# Patient Record
Sex: Male | Born: 1990 | Race: Black or African American | Hispanic: No | Marital: Single | Smoking: Never smoker
Health system: Southern US, Community
[De-identification: ages and names within clinical notes are randomized; demographics above are authoritative.]

## PROBLEM LIST (undated history)

## (undated) DIAGNOSIS — N200 Calculus of kidney: Secondary | ICD-10-CM

---
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
## 2015-06-28 ENCOUNTER — Emergency Department: Payer: Self-pay

## 2015-06-28 ENCOUNTER — Emergency Department
Admission: EM | Admit: 2015-06-28 | Discharge: 2015-06-28 | Disposition: A | Discharge status: Home / Self Care | Payer: Self-pay | Attending: Emergency Medicine | Admitting: Emergency Medicine

## 2015-06-28 DIAGNOSIS — K5900 Constipation, unspecified: Secondary | ICD-10-CM | POA: Insufficient documentation

## 2015-06-28 DIAGNOSIS — J069 Acute upper respiratory infection, unspecified: Secondary | ICD-10-CM | POA: Insufficient documentation

## 2015-06-28 DIAGNOSIS — R197 Diarrhea, unspecified: Secondary | ICD-10-CM | POA: Insufficient documentation

## 2015-06-28 MED ORDER — GUAIFENESIN-CODEINE 100-10 MG/5ML PO SOLN: 10.0000 mL | ORAL | Status: DC | PRN

## 2015-06-28 MED ORDER — IBUPROFEN 800 MG PO TABS: 800.0000 mg | ORAL_TABLET | Freq: Three times a day (TID) | ORAL | Status: DC | PRN

## 2015-06-28 MED ORDER — CHLORPHENIRAMINE MALEATE 4 MG PO TABS: 4.0000 mg | ORAL_TABLET | Freq: Two times a day (BID) | ORAL | Status: DC | PRN

## 2015-06-28 MED ORDER — AZITHROMYCIN 250 MG PO TABS: ORAL_TABLET | ORAL | Status: DC

## 2015-06-28 NOTE — ED Provider Notes (Signed)
[facility] Emergency Department Provider Note  ____________________________________________  Time seen: Approximately 8:02 PM  I have reviewed the triage vital signs and the nursing notes.   HISTORY  Chief Complaint Cough    HPI Roy Bridges is a 25 y.o. male presents for evaluation of cough congestion chills and body aches 3 days. Patient states that it is extremely loose stools and extremely hard stools associated with severe abdominal cramping. Had fever and chills which is comes and goes. Positive runny nose. Working 2 jobs   No past medical history on file.  There are no active problems to display for this patient.   No past surgical history on file.  Current Outpatient Rx  Name  Route  Sig  Dispense  Refill  . azithromycin (ZITHROMAX Z-PAK) 250 MG tablet      Take 2 tablets (500 mg) on  Day 1,  followed by 1 tablet (250 mg) once daily on Days 2 through 5.   6 each   0   . chlorpheniramine (CHLOR-TRIMETON) 4 MG tablet   Oral   Take 1 tablet (4 mg total) by mouth 2 (two) times daily as needed for allergies or rhinitis.   30 tablet   0   . guaiFENesin-codeine 100-10 MG/5ML syrup   Oral   Take 10 mLs by mouth every 4 (four) hours as needed for cough.   180 mL   0   . ibuprofen (ADVIL,MOTRIN) 800 MG tablet   Oral   Take 1 tablet (800 mg total) by mouth every 8 (eight) hours as needed.   30 tablet   0     Allergies Review of patient's allergies indicates no known allergies.  No family history on file.  Social History Social History  Substance Use Topics  . Smoking status: Not on file  . Smokeless tobacco: Not on file  . Alcohol Use: Not on file    Review of Systems Constitutional: Positive fever/chills Eyes: No visual changes. ENT: No sore throat. Cardiovascular: Denies chest pain. Respiratory: Denies shortness of breath. Positive for cough Gastrointestinal: Positive abdominal pain.  No nausea, no vomiting.   Complains of both diarrhea and constipation. With some cramping Genitourinary: Negative for dysuria. Musculoskeletal: Negative for back pain. Skin: Negative for rash. Neurological: Negative for headaches, focal weakness or numbness.  10-point ROS otherwise negative.  ____________________________________________   PHYSICAL EXAM:  VITAL SIGNS: ED Triage Vitals  Enc Vitals Group     BP 06/28/15 1941 122/72 mmHg     Pulse Rate 06/28/15 1941 105     Resp 06/28/15 1941 18     Temp 06/28/15 1941 98.5 F (36.9 C)     Temp Source 06/28/15 1941 Oral     SpO2 06/28/15 1941 96 %     Weight 06/28/15 1941 130 lb (58.968 kg)     Height 06/28/15 1941  (1.727 m)     Head Cir --      Peak Flow --      Pain Score 06/28/15 1942 7     Pain Loc --      Pain Edu? --      Excl. in GC? --     Constitutional: Alert and oriented. Well appearing and in no acute distress. Head: Atraumatic. Nose: No congestion/rhinnorhea. Mouth/Throat: Mucous membranes are moist.  Oropharynx non-erythematous. Neck: No stridor.   Cardiovascular: Normal rate, regular rhythm. Grossly normal heart sounds.  Good peripheral circulation. Respiratory: Normal respiratory effort.  No retractions. Lungs coarse  breath sounds bilaterally with no rhonchi noted. Gastrointestinal: Soft and mildly tender No distention. No abdominal bruits. No CVA tenderness. Musculoskeletal: No lower extremity tenderness nor edema.  No joint effusions. Neurologic:  Normal speech and language. No gross focal neurologic deficits are appreciated. No gait instability. Skin:  Skin is warm, dry and intact. No rash noted. Psychiatric: Mood and affect are normal. Speech and behavior are normal.  ____________________________________________   LABS (all labs ordered are listed, but only abnormal results are displayed)  Labs Reviewed - No data to display  RADIOLOGY  No acute cardiopulmonary findings. Monitor stool noted within the intestinal  blockage. No air-fluid levels. ____________________________________________   PROCEDURES  Procedure(s) performed: None  Critical Care performed: No  ____________________________________________   INITIAL IMPRESSION / ASSESSMENT AND PLAN / ED COURSE  Pertinent labs & imaging results that were available during my care of the patient were reviewed by me and considered in my medical decision making (see chart for details).  Acute URI with some distal burn. Rx given for Zithromax, Robitussin-AC, Motrin 800 mg and chlorpheniramine.. Work excuse 48 hours given. Patient follow-up PCP or return to ER with any worsening symptomology. ____________________________________________   FINAL CLINICAL IMPRESSION(S) / ED DIAGNOSES  Final diagnoses:  URI (upper respiratory infection)     This chart was dictated using voice recognition software/Dragon. Despite best efforts to proofread, errors can occur which can change the meaning. Any change was purely unintentional.   Roy Dakin, PA-C 06/28/15 2126  Phineas Semen, MD 06/28/15 2142

## 2015-06-28 NOTE — ED Notes (Signed)
Patient transported to X-ray 

## 2015-06-28 NOTE — Discharge Instructions (Signed)
Upper Respiratory Infection, Adult Most upper respiratory infections (URIs) are a viral infection of the air passages leading to the lungs. A URI affects the nose, throat, and upper air passages. The most common type of URI is nasopharyngitis and is typically referred to as "the common cold." URIs run their course and usually go away on their own. Most of the time, a URI does not require medical attention, but sometimes a bacterial infection in the upper airways can follow a viral infection. This is called a secondary infection. Sinus and middle ear infections are common types of secondary upper respiratory infections. Bacterial pneumonia can also complicate a URI. A URI can worsen asthma and chronic obstructive pulmonary disease (COPD). Sometimes, these complications can require emergency medical care and may be life threatening.  CAUSES Almost all URIs are caused by viruses. A virus is a type of germ and can spread from one person to another.  RISKS FACTORS You may be at risk for a URI if:   You smoke.   You have chronic heart or lung disease.  You have a weakened defense (immune) system.   You are very young or very old.   You have nasal allergies or asthma.  You work in crowded or poorly ventilated areas.  You work in health care facilities or schools. SIGNS AND SYMPTOMS  Symptoms typically develop 2-3 days after you come in contact with a cold virus. Most viral URIs last 7-10 days. However, viral URIs from the influenza virus (flu virus) can last 14-18 days and are typically more severe. Symptoms may include:   Runny or stuffy (congested) nose.   Sneezing.   Cough.   Sore throat.   Headache.   Fatigue.   Fever.   Loss of appetite.   Pain in your forehead, behind your eyes, and over your cheekbones (sinus pain).  Muscle aches.  DIAGNOSIS  Your health care provider may diagnose a URI by:  Physical exam.  Tests to check that your symptoms are not due to  another condition such as:  Strep throat.  Sinusitis.  Pneumonia.  Asthma. TREATMENT  A URI goes away on its own with time. It cannot be cured with medicines, but medicines may be prescribed or recommended to relieve symptoms. Medicines may help:  Reduce your fever.  Reduce your cough.  Relieve nasal congestion. HOME CARE INSTRUCTIONS   Take medicines only as directed by your health care provider.   Gargle warm saltwater or take cough drops to comfort your throat as directed by your health care provider.  Use a warm mist humidifier or inhale steam from a shower to increase air moisture. This may make it easier to breathe.  Drink enough fluid to keep your urine clear or pale yellow.   Eat soups and other clear broths and maintain good nutrition.   Rest as needed.   Return to work when your temperature has returned to normal or as your health care provider advises. You may need to stay home longer to avoid infecting others. You can also use a face mask and careful hand washing to prevent spread of the virus.  Increase the usage of your inhaler if you have asthma.   Do not use any tobacco products, including cigarettes, chewing tobacco, or electronic cigarettes. If you need help quitting, ask your health care provider. PREVENTION  The best way to protect yourself from getting a cold is to practice good hygiene.   Avoid oral or hand contact with people with cold   symptoms.   Wash your hands often if contact occurs.  There is no clear evidence that vitamin C, vitamin E, echinacea, or exercise reduces the chance of developing a cold. However, it is always recommended to get plenty of rest, exercise, and practice good nutrition.  SEEK MEDICAL CARE IF:   You are getting worse rather than better.   Your symptoms are not controlled by medicine.   You have chills.  You have worsening shortness of breath.  You have brown or red mucus.  You have yellow or brown nasal  discharge.  You have pain in your face, especially when you bend forward.  You have a fever.  You have swollen neck glands.  You have pain while swallowing.  You have white areas in the back of your throat. SEEK IMMEDIATE MEDICAL CARE IF:   You have severe or persistent:  Headache.  Ear pain.  Sinus pain.  Chest pain.  You have chronic lung disease and any of the following:  Wheezing.  Prolonged cough.  Coughing up blood.  A change in your usual mucus.  You have a stiff neck.  You have changes in your:  Vision.  Hearing.  Thinking.  Mood. MAKE SURE YOU:   Understand these instructions.  Will watch your condition.  Will get help right away if you are not doing well or get worse.   This information is not intended to replace advice given to you by your health care provider. Make sure you discuss any questions you have with your health care provider.   Document Released: 10/30/2000 Document Revised: 09/20/2014 Document Reviewed: 08/11/2013 Elsevier Interactive Patient Education 2016 Elsevier Inc.  

## 2015-06-28 NOTE — ED Notes (Signed)
Pt in with co cough, congestion, chills, bodyaches since Monday.

## 2016-10-18 ENCOUNTER — Encounter: Payer: Self-pay | Admitting: Emergency Medicine

## 2016-10-18 ENCOUNTER — Emergency Department
Admission: EM | Admit: 2016-10-18 | Discharge: 2016-10-18 | Disposition: A | Discharge status: Home / Self Care | Payer: Self-pay | Attending: Emergency Medicine | Admitting: Emergency Medicine

## 2016-10-18 DIAGNOSIS — S025XXB Fracture of tooth (traumatic), initial encounter for open fracture: Secondary | ICD-10-CM

## 2016-10-18 DIAGNOSIS — K0889 Other specified disorders of teeth and supporting structures: Secondary | ICD-10-CM | POA: Insufficient documentation

## 2016-10-18 DIAGNOSIS — Y998 Other external cause status: Secondary | ICD-10-CM | POA: Insufficient documentation

## 2016-10-18 DIAGNOSIS — X58XXXA Exposure to other specified factors, initial encounter: Secondary | ICD-10-CM | POA: Insufficient documentation

## 2016-10-18 DIAGNOSIS — Y9389 Activity, other specified: Secondary | ICD-10-CM | POA: Insufficient documentation

## 2016-10-18 DIAGNOSIS — S025XXA Fracture of tooth (traumatic), initial encounter for closed fracture: Secondary | ICD-10-CM | POA: Insufficient documentation

## 2016-10-18 DIAGNOSIS — Y929 Unspecified place or not applicable: Secondary | ICD-10-CM | POA: Insufficient documentation

## 2016-10-18 MED ORDER — NAPROXEN 500 MG PO TABS: 500.0000 mg | ORAL_TABLET | Freq: Two times a day (BID) | ORAL | 0 refills | Status: DC

## 2016-10-18 MED ORDER — LIDOCAINE VISCOUS 2 % MT SOLN: 10.0000 mL | OROMUCOSAL | 0 refills | Status: DC | PRN

## 2016-10-18 MED ORDER — AMOXICILLIN 500 MG PO CAPS: 500.0000 mg | ORAL_CAPSULE | Freq: Three times a day (TID) | ORAL | 0 refills | Status: DC

## 2016-10-18 NOTE — ED Triage Notes (Signed)
Pt broke left upper molar 2 weeks ago. Has not called dentist. Pain shoots up to cheek per his report. NAD. Ambulatory to triage.

## 2016-10-18 NOTE — Discharge Instructions (Signed)
OPTIONS FOR DENTAL FOLLOW UP CARE ° °[place] Department of Health and Human Services - Local Safety Net Dental Clinics °http://www.ncdhhs.gov/dph/oralhealth/services/safetynetclinics.htm °  °[facility] (336-562-3123) ° °[facility] [place] (919-933-9087) ° °[facility] ([phone] ext 237) ° °[facility] ([phone]) ° °[facility] ([phone]) °This clinic caters to the indigent population and is on a lottery system. °Location: °[facility], [address], [place] °Clinic Hours: °Wednesdays from 6pm - 9pm, patients seen by a lottery system. °For dates, call or go to www.med.[facility].edu/[facility]/patients/Dental-[facility] °Services: °Cleanings, fillings and simple extractions. °Payment Options: °DENTAL WORK IS FREE OF CHARGE. Bring proof of income or support. °Best way to get seen: °Arrive at 5:15 pm - this is a lottery, NOT first come/first serve, so arriving earlier will not increase your chances of being seen. °  °  °[facility] °[phone] °Select option 1 for emergencies °  °Location: °[facility], [address], [place] °Clinic Hours: °No walk-ins accepted - call the day before to schedule an appointment. °Check in times are 9:30 am and 1:30 pm. °Services: °Simple extractions, temporary fillings, pulpectomy/pulp debridement, uncomplicated abscess drainage. °Payment Options: °PAYMENT IS DUE AT THE TIME OF SERVICE.  Fee is usually $100-200, additional surgical procedures (e.g. abscess drainage) may be extra. °Cash, checks, Visa/MasterCard accepted.  Can file Medicaid if patient is covered for dental - patient should call case worker to check. °No discount for [facility] patients. °Best way to get seen: °MUST call the day before and get onto the schedule. Can usually be seen the next 1-2 days. No walk-ins accepted. °  °  °[facility] °[phone] °   °Location: °[facility], [address], [place] °Clinic Hours: °M, W, Th, F 8am or 1:30pm, Tues 9a or 1:30 - first come/first served. °Services: °Simple extractions, temporary fillings, uncomplicated abscess drainage.  You do not need to be an [place] resident. °Payment Options: °PAYMENT IS DUE AT THE TIME OF SERVICE. °Dental insurance, otherwise sliding scale - bring proof of income or support. °Depending on income and treatment needed, cost is usually $50-200. °Best way to get seen: °Arrive early as it is first come/first served. °  °  °[facility] °[phone] °  °Location: °[address] °Clinic Hours: °Mon-Thu 8a-5p °Services: °Most basic dental services including extractions and fillings. °Payment Options: °PAYMENT IS DUE AT THE TIME OF SERVICE. °Sliding scale, up to 50% off - bring proof if income or support. °Medicaid with dental option accepted. °Best way to get seen: °Call to schedule an appointment, can usually be seen within 2 weeks OR they will try to see walk-ins - show up at 8a or 2p (you may have to wait). °  °  °[facility] °919-245-2435 °[place] RESIDENTS ONLY °  °Location: °[facility], [address], [place],  [address] °Clinic Hours: By appointment only. °Monday - Thursday 8am-5pm, Friday 8am-12pm °Services: Cleanings, fillings, extractions. °Payment Options: °PAYMENT IS DUE AT THE TIME OF SERVICE. °Cash, Visa or MasterCard. Sliding scale - $30 minimum per service. °Best way to get seen: °Come in to office, complete packet and make an appointment - need proof of income °or support monies for each household member and proof of [place] residence. °Usually takes about a month to get in. °  °  °[facility] °[phone] °  °Location: °[address]   [place] °Clinic Hours: Walk-in Urgent Care Dental Services are offered Monday-Friday  mornings only. °The numbers of emergencies accepted daily is limited to the number of °providers available. °Maximum 15 - Mondays, Wednesdays & Thursdays °Maximum 10 - Tuesdays & Fridays °Services: °You do not need to be a [place] resident to be seen for a dental emergency. °Emergencies are defined as pain, swelling, abnormal bleeding, or dental trauma. Walkins will receive x-rays if needed. °NOTE: Dental cleaning is not an emergency. °Payment Options: °PAYMENT IS DUE AT THE TIME OF SERVICE. °Minimum co-pay is $40.00 for uninsured patients. °Minimum co-pay is $3.00 for Medicaid with dental coverage. °Dental Insurance is accepted and must be presented at time of visit. °Medicare does not cover dental. °Forms of payment: Cash, credit card, checks. °Best way to get seen: °If not previously registered with the clinic, walk-in dental registration begins at 7:15 am and is on a first come/first serve basis. °If previously registered with the clinic, call to make an appointment. °  °  °[facility] °[phone] °[place] RESIDENTS ONLY °  °Location: °[address], [place], [place] °Clinic Hours: °Mon-Thu 10a-2p °Services: Extractions only! °Payment Options: °FREE (donations accepted) - bring proof of income or support °Best way to get seen: °Call and schedule an appointment OR come at 8am on the 1st Monday of every month (except for holidays) when it is first come/first served. °  °  °[facility] °[phone] °  °Location: °[address], [place] °Clinic Hours: °Friday mornings °Services, Payment Options, Best way to get seen: °Call for info °

## 2016-10-18 NOTE — ED Provider Notes (Signed)
[facility] Emergency Department Provider Note  ____________________________________________  Time seen: Approximately 7:10 PM  I have reviewed the triage vital signs and the nursing notes.   HISTORY  Chief Complaint Dental Pain    HPI Roy Bridges is a 26 y.o. male that presents to the emergency department with right upper dental pain for 2 weeks. Patient states that he was eating pork skins when a rock broke off a large portion of his tooth. Pain has increased in the last day and shoots up into his cheek. He heard that a dental abscess can be life-threatening so he came to the emergency department. He has not seen a dentist in 2 years. He needs to wait until he gets paid before he can see a dentist. He denies fever, difficulty opening and closing mouth, drainage from mouth, swelling, shortness of breath, chest pain, nausea, vomiting, abdominal pain.   History reviewed. No pertinent past medical history.  There are no active problems to display for this patient.   History reviewed. No pertinent surgical history.  Prior to Admission medications   Medication Sig Start Date End Date Taking? Authorizing Provider  amoxicillin (AMOXIL) 500 MG capsule Take 1 capsule (500 mg total) by mouth 3 (three) times daily. 10/18/16   Enid Derry, PA-C  azithromycin (ZITHROMAX Z-PAK) 250 MG tablet Take 2 tablets (500 mg) on  Day 1,  followed by 1 tablet (250 mg) once daily on Days 2 through 5. 06/28/15   Beers, Charmayne Sheer, PA-C  chlorpheniramine (CHLOR-TRIMETON) 4 MG tablet Take 1 tablet (4 mg total) by mouth 2 (two) times daily as needed for allergies or rhinitis. 06/28/15   Beers, Charmayne Sheer, PA-C  guaiFENesin-codeine 100-10 MG/5ML syrup Take 10 mLs by mouth every 4 (four) hours as needed for cough. 06/28/15   Beers, Charmayne Sheer, PA-C  ibuprofen (ADVIL,MOTRIN) 800 MG tablet Take 1 tablet (800 mg total) by mouth every 8 (eight) hours as needed. 06/28/15   Beers, Charmayne Sheer, PA-C   lidocaine (XYLOCAINE) 2 % solution Use as directed 10 mLs in the mouth or throat as needed for mouth pain. 10/18/16   Enid Derry, PA-C  naproxen (NAPROSYN) 500 MG tablet Take 1 tablet (500 mg total) by mouth 2 (two) times daily with a meal. 10/18/16 10/18/17  Enid Derry, PA-C    Allergies Patient has no known allergies.  History reviewed. No pertinent family history.  Social History Social History  Substance Use Topics  . Smoking status: Never Smoker  . Smokeless tobacco: Never Used  . Alcohol use Not on file     Review of Systems  Constitutional: No fever/chills Cardiovascular: No chest pain. Respiratory: No SOB. Gastrointestinal: No abdominal pain.  No nausea, no vomiting.  Musculoskeletal: Negative for musculoskeletal pain. Skin: Negative for rash, abrasions, lacerations, ecchymosis. Neurological: Negative for headaches, numbness or tingling   ____________________________________________   PHYSICAL EXAM:  VITAL SIGNS: ED Triage Vitals [10/18/16 1745]  Enc Vitals Group     BP      Pulse Rate 78     Resp 18     Temp 98.8 F (37.1 C)     Temp Source Oral     SpO2 100 %     Weight 130 lb (59 kg)     Height 5\' 7"  (1.702 m)     Head Circumference      Peak Flow      Pain Score 10     Pain Loc      Pain  Edu?      Excl. in GC?      Constitutional: Alert and oriented. Well appearing and in no acute distress. Eyes: Conjunctivae are normal. PERRL. EOMI. Head: Atraumatic. ENT:      Ears:      Nose: No congestion/rhinnorhea.      Mouth/Throat: Mucous membranes are moist. Large fracture in tooth #15. No drainage from tooth. No swelling. No TMJ pain. Neck: No stridor.   Cardiovascular: Normal rate, regular rhythm.  Good peripheral circulation. Respiratory: Normal respiratory effort without tachypnea or retractions. Lungs CTAB. Good air entry to the bases with no decreased or absent breath sounds. Gastrointestinal: Bowel sounds 4 quadrants. Soft and nontender  to palpation. No guarding or rigidity. No palpable masses. No distention.  Musculoskeletal: Full range of motion to all extremities. No gross deformities appreciated. Neurologic:  Normal speech and language. No gross focal neurologic deficits are appreciated.  Skin:  Skin is warm, dry and intact. No rash noted.   ____________________________________________   LABS (all labs ordered are listed, but only abnormal results are displayed)  Labs Reviewed - No data to display ____________________________________________  EKG   ____________________________________________  RADIOLOGY   No results found.  ____________________________________________    PROCEDURES  Procedure(s) performed:    Procedures    Medications - No data to display   ____________________________________________   INITIAL IMPRESSION / ASSESSMENT AND PLAN / ED COURSE  Pertinent labs & imaging results that were available during my care of the patient were reviewed by me and considered in my medical decision making (see chart for details).  Review of the [place] CSRS was performed in accordance of the [facility] prior to dispensing any controlled drugs.   Patient's diagnosis is consistent with tooth fracture. Vital signs and exam are reassuring. Dental resources were given. Patient will be discharged home with prescriptions for amoxicillin, viscous lidocaine, naproxen. Patient is to follow up with dentist as directed. Patient is given ED precautions to return to the ED for any worsening or new symptoms.     ____________________________________________  FINAL CLINICAL IMPRESSION(S) / ED DIAGNOSES  Final diagnoses:  Open fracture of tooth, initial encounter      NEW MEDICATIONS STARTED DURING THIS VISIT:  Discharge Medication List as of 10/18/2016  7:20 PM    START taking these medications   Details  amoxicillin (AMOXIL) 500 MG capsule Take 1 capsule (500 mg total) by mouth 3 (three) times daily.,  Starting Fri 10/18/2016, Print    lidocaine (XYLOCAINE) 2 % solution Use as directed 10 mLs in the mouth or throat as needed for mouth pain., Starting Fri 10/18/2016, Print    naproxen (NAPROSYN) 500 MG tablet Take 1 tablet (500 mg total) by mouth 2 (two) times daily with a meal., Starting Fri 10/18/2016, Until Sat 10/18/2017, Print            This chart was dictated using voice recognition software/Dragon. Despite best efforts to proofread, errors can occur which can change the meaning. Any change was purely unintentional.    Enid DerryWagner, Kaye Mitro, PA-C 10/18/16 2010    Emily FilbertWilliams, Jonathan E, MD 10/18/16 2253

## 2017-05-15 ENCOUNTER — Emergency Department
Admission: EM | Admit: 2017-05-15 | Discharge: 2017-05-15 | Disposition: A | Discharge status: Home / Self Care | Payer: Self-pay | Attending: Emergency Medicine | Admitting: Emergency Medicine

## 2017-05-15 ENCOUNTER — Encounter: Payer: Self-pay | Admitting: Emergency Medicine

## 2017-05-15 DIAGNOSIS — Y929 Unspecified place or not applicable: Secondary | ICD-10-CM | POA: Insufficient documentation

## 2017-05-15 DIAGNOSIS — Y939 Activity, unspecified: Secondary | ICD-10-CM | POA: Insufficient documentation

## 2017-05-15 DIAGNOSIS — Y990 Civilian activity done for income or pay: Secondary | ICD-10-CM | POA: Insufficient documentation

## 2017-05-15 DIAGNOSIS — S30861A Insect bite (nonvenomous) of abdominal wall, initial encounter: Secondary | ICD-10-CM | POA: Insufficient documentation

## 2017-05-15 DIAGNOSIS — W57XXXA Bitten or stung by nonvenomous insect and other nonvenomous arthropods, initial encounter: Secondary | ICD-10-CM | POA: Insufficient documentation

## 2017-05-15 MED ORDER — SULFAMETHOXAZOLE-TRIMETHOPRIM 800-160 MG PO TABS: 1.0000 | ORAL_TABLET | Freq: Two times a day (BID) | ORAL | 0 refills | Status: DC

## 2017-05-15 NOTE — ED Provider Notes (Signed)
[facility] Emergency Department Provider Note  ____________________________________________   First MD Initiated Contact with Patient 05/15/17 1850     (approximate)  I have reviewed the triage vital signs and the nursing notes.   HISTORY  Chief Complaint Insect Bite    HPI Roy Bridges is a 26 y.o. male but states he was at work and felt a bug bite inside his pants on the lower abdomen, he said there is a red swollen area, unsure what type of insect bit him, his boss instructed him to come up here and have it checked out, he denies fever, chills, vomiting or diarrhea, or muscle aches  History reviewed. No pertinent past medical history.  There are no active problems to display for this patient.   History reviewed. No pertinent surgical history.  Prior to Admission medications   Medication Sig Start Date End Date Taking? Authorizing Provider  sulfamethoxazole-trimethoprim (BACTRIM DS,SEPTRA DS) 800-160 MG tablet Take 1 tablet by mouth 2 (two) times daily. 05/15/17   Faythe GheeFisher, Susan W, PA-C    Allergies Patient has no known allergies.  No family history on file.  Social History Social History   Tobacco Use  . Smoking status: Never Smoker  . Smokeless tobacco: Never Used  Substance Use Topics  . Alcohol use: No    Frequency: Never  . Drug use: No    Review of Systems  Constitutional: No fever/chills Eyes: No visual changes. ENT: No sore throat. Respiratory: Denies cough Genitourinary: Negative for dysuria. Musculoskeletal: Negative for back pain. Skin: Positive for bug bite    ____________________________________________   PHYSICAL EXAM:  VITAL SIGNS: ED Triage Vitals  Enc Vitals Group     BP 05/15/17 1849 116/61     Pulse Rate 05/15/17 1849 82     Resp 05/15/17 1849 18     Temp 05/15/17 1849 98.5 F (36.9 C)     Temp Source 05/15/17 1849 Oral     SpO2 05/15/17 1849 97 %     Weight 05/15/17 1842 130 lb (59 kg)   Height 05/15/17 1842 5\' 8"  (1.727 m)     Head Circumference --      Peak Flow --      Pain Score 05/15/17 1842 7     Pain Loc --      Pain Edu? --      Excl. in GC? --     Constitutional: Alert and oriented. Well appearing and in no acute distress. Eyes: Conjunctivae are normal.  Head: Atraumatic. Nose: No congestion/rhinnorhea. Mouth/Throat: Mucous membranes are moist.   Cardiovascular: Normal rate, regular rhythm.  Heart sounds are normal Respiratory: Normal respiratory effort.  No retractions, lungs are clear to auscultation GU: deferred Musculoskeletal: FROM all extremities, warm and well perfused Neurologic:  Normal speech and language.  Skin:  Skin is warm, dry and intact.  Positive for small tiny red raised area in the pubic area, there are no pustules, no drainage, neurovascular is intact. Psychiatric: Mood and affect are normal. Speech and behavior are normal.  ____________________________________________   LABS (all labs ordered are listed, but only abnormal results are displayed)  Labs Reviewed - No data to display ____________________________________________   ____________________________________________  RADIOLOGY    ____________________________________________   PROCEDURES  Procedure(s) performed: No      ____________________________________________   INITIAL IMPRESSION / ASSESSMENT AND PLAN / ED COURSE  Pertinent labs & imaging results that were available during my care of the patient were reviewed by me  and considered in my medical decision making (see chart for details).  Patient is a 26 year old male complaining of a bug bite to the lower abdomen, on physical exam the areas minimally red and swollen, he was instructed to apply warm compress to the area and apply hydrocortisone cream, if the area becomes much larger and red he can use an antibiotic, he states he understands, he was discharged in stable condition, he was also given a work  note      ____________________________________________   FINAL CLINICAL IMPRESSION(S) / ED DIAGNOSES  Final diagnoses:  Insect bite, initial encounter      NEW MEDICATIONS STARTED DURING THIS VISIT:  This SmartLink is deprecated. Use AVSMEDLIST instead to display the medication list for a patient.   Note:  This document was prepared using Dragon voice recognition software and may include unintentional dictation errors.    Faythe GheeFisher, Susan W, PA-C 05/15/17 2148    Minna AntisPaduchowski, Kevin, MD 05/15/17 2300

## 2017-05-15 NOTE — ED Triage Notes (Signed)
Pt comes into the ED via POV c/o spider bite to the lower abdomen.  Denies any fevers at this time and patient in NAD.

## 2017-05-15 NOTE — Discharge Instructions (Signed)
Use over-the-counter hydrocortisone cream, if the area becomes more red and swollen and looks infected then use the antibiotic as instructed, you can apply warm compress to the area

## 2017-10-15 LAB — HM HIV SCREENING LAB: HM HIV Screening: NEGATIVE

## 2018-06-29 LAB — HM HIV SCREENING LAB: HM HIV Screening: NEGATIVE

## 2018-11-04 ENCOUNTER — Encounter: Payer: Self-pay | Admitting: Emergency Medicine

## 2018-11-04 ENCOUNTER — Emergency Department
Admission: EM | Admit: 2018-11-04 | Discharge: 2018-11-04 | Disposition: A | Discharge status: Home / Self Care | Payer: Self-pay | Attending: Emergency Medicine | Admitting: Emergency Medicine

## 2018-11-04 ENCOUNTER — Other Ambulatory Visit: Payer: Self-pay

## 2018-11-04 DIAGNOSIS — R2241 Localized swelling, mass and lump, right lower limb: Secondary | ICD-10-CM | POA: Insufficient documentation

## 2018-11-04 DIAGNOSIS — Z5321 Procedure and treatment not carried out due to patient leaving prior to being seen by health care provider: Secondary | ICD-10-CM | POA: Insufficient documentation

## 2018-11-04 NOTE — ED Triage Notes (Signed)
Insect bite to right second toe yesterday evening.  C/O pain and swelling.  Trace swelling noted right second toe.  NAD

## 2018-11-04 NOTE — ED Notes (Signed)
See triage note: swollen 2nd toe on right foot. No pain, slight numbness. Pt believes that he was bit by a spider.

## 2019-10-25 ENCOUNTER — Encounter: Payer: Self-pay | Admitting: Family Medicine

## 2019-10-25 ENCOUNTER — Ambulatory Visit: Payer: Self-pay | Admitting: Family Medicine

## 2019-10-25 ENCOUNTER — Other Ambulatory Visit: Payer: Self-pay

## 2019-10-25 DIAGNOSIS — Z202 Contact with and (suspected) exposure to infections with a predominantly sexual mode of transmission: Secondary | ICD-10-CM

## 2019-10-25 MED ORDER — AZITHROMYCIN 500 MG PO TABS
1000.0000 mg | ORAL_TABLET | Freq: Once | ORAL | Status: AC
  Administered 2019-10-25: 1000 mg via ORAL

## 2019-10-25 NOTE — Progress Notes (Signed)
Pt treated per provider order. Provider orders completed. 

## 2019-10-25 NOTE — Progress Notes (Signed)
  [facility] Department STI clinic/screening visit  Subjective:  Roy Bridges is a 29 y.o. male being seen today for No chief complaint on file.    The patient reports they do not have symptoms.   Patient has the following medical conditions:  There are no problems to display for this patient.   HPI  Pt reports he is a contact to chlamydia, here for treatment. Denies symptoms. Declines exam or STI screening.   See flowsheet for further details and programmatic requirements.   Last HIV test per pt/review of record: 06/2018 Last tetanus vaccine: 2016  No components found for: HCV  The following portions of the patient's history were reviewed and updated as appropriate: allergies, current medications, past medical history, past social history, past surgical history and problem list.  Objective:  There were no vitals filed for this visit.   Physical Exam Gen: well appearing, NAD HEENT: no scleral icterus Lung: Normal WOB Ext: well perfused, no edema  Pt declines further physical exam.   Assessment and Plan:  Roy Bridges is a 29 y.o. male presenting to the [facility] Department for STI screening   1. Chlamydia contact -Treatment as contact today as below, declines STI screening or exam. -Pt counseled regarding medication, including to RTC if vomits < 2 hr after taking medicine. No known allergies to this medication. -Advised no sex for 7 days after both pt and partner completes treatment and encouraged condoms with all sex. - azithromycin (ZITHROMAX) tablet 1,000 mg    Return for screening as needed.  No future appointments.  Ann Held, PA-C

## 2020-01-21 ENCOUNTER — Other Ambulatory Visit: Payer: Self-pay

## 2020-01-21 ENCOUNTER — Emergency Department: Payer: No Typology Code available for payment source

## 2020-01-21 ENCOUNTER — Emergency Department
Admission: EM | Admit: 2020-01-21 | Discharge: 2020-01-21 | Disposition: A | Discharge status: Home / Self Care | Payer: No Typology Code available for payment source | Attending: Emergency Medicine | Admitting: Emergency Medicine

## 2020-01-21 ENCOUNTER — Encounter: Payer: Self-pay | Admitting: Emergency Medicine

## 2020-01-21 DIAGNOSIS — Y999 Unspecified external cause status: Secondary | ICD-10-CM | POA: Diagnosis not present

## 2020-01-21 DIAGNOSIS — Z91040 Latex allergy status: Secondary | ICD-10-CM | POA: Diagnosis not present

## 2020-01-21 DIAGNOSIS — S39012A Strain of muscle, fascia and tendon of lower back, initial encounter: Secondary | ICD-10-CM | POA: Diagnosis present

## 2020-01-21 DIAGNOSIS — Y93I9 Activity, other involving external motion: Secondary | ICD-10-CM | POA: Diagnosis not present

## 2020-01-21 DIAGNOSIS — Y929 Unspecified place or not applicable: Secondary | ICD-10-CM | POA: Insufficient documentation

## 2020-01-21 NOTE — Discharge Instructions (Addendum)
Follow-up with your primary care provider or [facility] acute care if any continued problems.  Take ibuprofen as prescribed if needed for soreness and inflammation.  You may also take Tylenol with this medication if additional pain medication as needed.  Use ice or heat to your muscles as needed for discomfort and move frequently to avoid stiffness.
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
## 2020-10-30 ENCOUNTER — Encounter: Payer: Self-pay | Admitting: Physician Assistant

## 2020-10-30 ENCOUNTER — Other Ambulatory Visit: Payer: Self-pay

## 2020-10-30 ENCOUNTER — Ambulatory Visit: Payer: Self-pay

## 2020-10-30 ENCOUNTER — Ambulatory Visit: Payer: Self-pay | Admitting: Physician Assistant

## 2020-10-30 DIAGNOSIS — Z202 Contact with and (suspected) exposure to infections with a predominantly sexual mode of transmission: Secondary | ICD-10-CM

## 2020-10-30 DIAGNOSIS — Z113 Encounter for screening for infections with a predominantly sexual mode of transmission: Secondary | ICD-10-CM

## 2020-10-30 MED ORDER — DOXYCYCLINE HYCLATE 100 MG PO TABS
100.0000 mg | ORAL_TABLET | Freq: Two times a day (BID) | ORAL | 0 refills | Status: AC
Start: 2020-10-30 — End: 2020-11-06

## 2020-10-30 NOTE — Progress Notes (Signed)
   [facility] Department STI clinic/screening visit  Subjective:  SORA OLIVO is a 30 y.o. male being seen today for an STI screening visit. The patient reports they do not have symptoms.    Patient has the following medical conditions:  There are no problems to display for this patient.    Chief Complaint  Patient presents with   SEXUALLY TRANSMITTED DISEASE    STD screen    HPI  Patient reports that he is not having any symptoms but is a contact to Chlamydia.  Patient denies chronic conditions and regular medicines.  States recently had dental surgery to remove wisdom teeth on one side. Reports last HIV test was about 6 months ago.    See flowsheet for further details and programmatic requirements.    The following portions of the patient's history were reviewed and updated as appropriate: allergies, current medications, past medical history, past social history, past surgical history and problem list.  Objective:  There were no vitals filed for this visit.  Physical Exam Constitutional:      General: He is not in acute distress.    Appearance: Normal appearance.  HENT:     Head: Normocephalic and atraumatic.  Eyes:     Conjunctiva/sclera: Conjunctivae normal.  Pulmonary:     Effort: Pulmonary effort is normal.  Skin:    General: Skin is warm and dry.  Neurological:     Mental Status: He is alert and oriented to person, place, and time.  Psychiatric:        Mood and Affect: Mood normal.        Behavior: Behavior normal.        Thought Content: Thought content normal.        Judgment: Judgment normal.      Assessment and Plan:  TRINTEN BOUDOIN is a 30 y.o. male presenting to the [facility] Department for STI screening  1. Screening for STD (sexually transmitted disease) Patient into clinic without symptoms. Patient declines screening testing and blood work today.  Requests treatment as a contact only. Counseled patient that  tetanus update is overdue and referred to RN for immunization.  Rec condoms with all sex.    2. Chlamydia contact Will treat as a contact to Chlamydia with Doxycycline 100 mg #14 1 po BID for 7 days. No sex for 14 days and until after partner completes treatment. Call with questions or concerns. - doxycycline (VIBRA-TABS) 100 MG tablet; Take 1 tablet (100 mg total) by mouth 2 (two) times daily for 7 days.  Dispense: 14 tablet; Refill: 0     No follow-ups on file.  No future appointments.  Matt Holmes, PA

## 2020-10-30 NOTE — Progress Notes (Signed)
Provider orders completed. 
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
## 2020-10-31 ENCOUNTER — Telehealth: Payer: Self-pay | Admitting: Family Medicine

## 2020-10-31 NOTE — Telephone Encounter (Signed)
Patient called to be advised what to do as he threw up his medication. Let him know I'd pass his information on to a nurse and he'd get a call back.

## 2020-11-02 ENCOUNTER — Other Ambulatory Visit: Payer: Self-pay

## 2020-11-02 ENCOUNTER — Ambulatory Visit: Payer: Self-pay | Admitting: Physician Assistant

## 2020-11-02 VITALS — Wt 124.2 lb

## 2020-11-02 DIAGNOSIS — Z202 Contact with and (suspected) exposure to infections with a predominantly sexual mode of transmission: Secondary | ICD-10-CM

## 2020-11-02 MED ORDER — CEFTRIAXONE SODIUM 500 MG IJ SOLR
500.0000 mg | Freq: Once | INTRAMUSCULAR | Status: AC
  Administered 2020-11-02: 500 mg via INTRAMUSCULAR

## 2020-11-02 NOTE — Progress Notes (Signed)
Pt is here as a contact to Gonorrhea.  Ceftriaxone 500 mg given IM in RUOQ without any complications.  Pt encouraged to complete his Doxycycline given on 10/30/2020.  Non latex condoms given. Berdie Ogren, RN

## 2020-11-03 NOTE — Progress Notes (Signed)
S:  Patient into clinic with contact card for [facility].  Patient denies any current symptoms or changes to history since his visit on 10/30/2020. NKDA. O:  WDWN male in NAD, A&O x 3, normal work of breathing.  Labs from 10/30/2020 still pending. A/P: 1.  Patient needs treatment as a contact to [facility]. 2.  Patient needs to complete his treatment as a contact to Chlamydia as directed. 3.  Treat as a contact to GC with Ceftriaxone 500 mg IM today. 4.  No sex for 14 days and until after partner/s complete treatment. 5.  Enc condoms with all sex. 6.  RTC prn.

## 2020-11-04 NOTE — Progress Notes (Signed)
Chart reviewed by Pharmacist  Suzanne Walker PharmD, Contract Pharmacist at [facility]  

## 2021-02-12 ENCOUNTER — Other Ambulatory Visit: Payer: Self-pay

## 2021-02-12 ENCOUNTER — Emergency Department
Admission: EM | Admit: 2021-02-12 | Discharge: 2021-02-12 | Disposition: A | Discharge status: Home / Self Care | Payer: Self-pay | Attending: Emergency Medicine | Admitting: Emergency Medicine

## 2021-02-12 ENCOUNTER — Emergency Department: Payer: Self-pay

## 2021-02-12 DIAGNOSIS — Z91040 Latex allergy status: Secondary | ICD-10-CM | POA: Insufficient documentation

## 2021-02-12 DIAGNOSIS — N200 Calculus of kidney: Secondary | ICD-10-CM | POA: Insufficient documentation

## 2021-02-12 HISTORY — DX: Calculus of kidney: N20.0

## 2021-02-12 LAB — URINALYSIS, COMPLETE (UACMP) WITH MICROSCOPIC
Bacteria, UA: NONE SEEN
Bilirubin Urine: NEGATIVE
Glucose, UA: NEGATIVE mg/dL
Hgb urine dipstick: NEGATIVE
Ketones, ur: NEGATIVE mg/dL
Leukocytes,Ua: NEGATIVE
Nitrite: NEGATIVE
Protein, ur: NEGATIVE mg/dL
Specific Gravity, Urine: 1 — ABNORMAL LOW (ref 1.005–1.030)
Squamous Epithelial / LPF: NONE SEEN (ref 0–5)
WBC, UA: NONE SEEN WBC/hpf (ref 0–5)
pH: 7 (ref 5.0–8.0)

## 2021-02-12 LAB — CBC
HCT: 44.5 % (ref 39.0–52.0)
Hemoglobin: 15.8 g/dL (ref 13.0–17.0)
MCH: 28.3 pg (ref 26.0–34.0)
MCHC: 35.5 g/dL (ref 30.0–36.0)
MCV: 79.6 fL — ABNORMAL LOW (ref 80.0–100.0)
Platelets: 249 10*3/uL (ref 150–400)
RBC: 5.59 MIL/uL (ref 4.22–5.81)
RDW: 13.2 % (ref 11.5–15.5)
WBC: 5.2 10*3/uL (ref 4.0–10.5)
nRBC: 0 % (ref 0.0–0.2)

## 2021-02-12 LAB — BASIC METABOLIC PANEL
Anion gap: 13 (ref 5–15)
BUN: 12 mg/dL (ref 6–20)
CO2: 24 mmol/L (ref 22–32)
Calcium: 9.6 mg/dL (ref 8.9–10.3)
Chloride: 98 mmol/L (ref 98–111)
Creatinine, Ser: 1.49 mg/dL — ABNORMAL HIGH (ref 0.61–1.24)
GFR, Estimated: >60 mL/min (ref >60)
Glucose, Bld: 117 mg/dL — ABNORMAL HIGH (ref 70–99)
Potassium: 3.9 mmol/L (ref 3.5–5.1)
Sodium: 135 mmol/L (ref 135–145)

## 2021-02-12 MED ORDER — IBUPROFEN 200 MG PO TABS: 400.0000 mg | ORAL_TABLET | Freq: Four times a day (QID) | ORAL | 0 refills | Status: AC | PRN

## 2021-02-12 NOTE — ED Notes (Signed)
C?O RLQ pain that woke patientup last night.  Describes pain at that time as stabbing.  Denies current complaint.  AAOx3.  Skin warm and dry>NAD

## 2021-02-12 NOTE — Discharge Instructions (Signed)
You have several kidney stones that are located in the kidneys.  Usually these do not cause significant pain so I am not sure that that is the cause of your right-sided abdominal pain.  If you develop fevers, nausea vomiting or worsening pain, please return to the emergency department.  Please follow-up with a primary care physician for further management.  You can take Motrin for the pain.

## 2021-02-12 NOTE — ED Provider Notes (Signed)
[facility]  ____________________________________________   Event Date/Time   First MD Initiated Contact with Patient 02/12/21 0945     (approximate)  I have reviewed the triage vital signs and the nursing notes.   HISTORY  Chief Complaint Flank Pain    HPI Roy Bridges is a 30 y.o. male past medical history of prior kidney stones presents with flank pain.  Patient notes that he was diagnosed with kidney stones about 6 months ago.  He was told that they were small and likely passed on its own. Pain is on the right side primarily.  Since that time and has been intermittently causing him pain.  Last night it was the worst that it has been.  Pain was in the right lower quadrant radiating to the groin.  He denies any dysuria.  Has had urinary frequency and urgency in the past.  Denies any scrotal swelling or testicular pain.  No fevers chills nausea vomiting.  Has not yet seen urology.  Not taking anything for pain.  Currently his pain is much improved.         Past Medical History:  Diagnosis Date   Kidney stones     There are no problems to display for this patient.   History reviewed. No pertinent surgical history.  Prior to Admission medications   Medication Sig Start Date End Date Taking? Authorizing Provider  ibuprofen (MOTRIN IB) 200 MG tablet Take 2 tablets (400 mg total) by mouth every 6 (six) hours as needed for up to 10 days. 02/12/21 02/22/21 Yes Georga Hacking, MD    Allergies Latex  No family history on file.  Social History Social History   Tobacco Use   Smoking status: Never   Smokeless tobacco: Never  Vaping Use   Vaping Use: Never used  Substance Use Topics   Alcohol use: No   Drug use: No    Review of Systems   Review of Systems  Constitutional:  Negative for appetite change, chills and fever.  Gastrointestinal:  Positive for abdominal pain. Negative for diarrhea, nausea and vomiting.  Genitourinary:  Positive  for flank pain. Negative for dysuria.  All other systems reviewed and are negative.  Physical Exam Updated Vital Signs BP 100/72 (BP Location: Left Arm)   Pulse 64   Temp 98 F (36.7 C)   Resp 15   SpO2 100%   Physical Exam Vitals and nursing note reviewed.  Constitutional:      General: He is not in acute distress.    Appearance: Normal appearance.  HENT:     Head: Normocephalic and atraumatic.  Eyes:     General: No scleral icterus.    Conjunctiva/sclera: Conjunctivae normal.  Pulmonary:     Effort: Pulmonary effort is normal. No respiratory distress.     Breath sounds: Normal breath sounds. No wheezing.  Abdominal:     General: Abdomen is flat.     Palpations: Abdomen is soft.     Tenderness: There is no right CVA tenderness or left CVA tenderness.     Comments: Mild tenderness to deep palpation in the right lower quadrant, no guarding  Musculoskeletal:        General: No deformity or signs of injury.     Cervical back: Normal range of motion.  Skin:    Coloration: Skin is not jaundiced or pale.  Neurological:     General: No focal deficit present.     Mental Status: He is alert and  oriented to person, place, and time. Mental status is at baseline.  Psychiatric:        Mood and Affect: Mood normal.        Behavior: Behavior normal.     LABS (all labs ordered are listed, but only abnormal results are displayed)  Labs Reviewed  URINALYSIS, COMPLETE (UACMP) WITH MICROSCOPIC - Abnormal; Notable for the following components:      Result Value   Color, Urine YELLOW (*)    APPearance HAZY (*)    Specific Gravity, Urine 1.000 (*)    All other components within normal limits  BASIC METABOLIC PANEL - Abnormal; Notable for the following components:   Glucose, Bld 117 (*)    Creatinine, Ser 1.49 (*)    All other components within normal limits  CBC - Abnormal; Notable for the following components:   MCV 79.6 (*)    All other components within normal limits    ____________________________________________  EKG  N/a ____________________________________________  RADIOLOGY Ky Barban, personally viewed and evaluated these images (plain radiographs) as part of my medical decision making, as well as reviewing the written report by the radiologist.  ED MD interpretation: I reviewed the CT scan which shows several intrarenal stones but no obstructing stone    ____________________________________________   PROCEDURES  Procedure(s) performed (including Critical Care):  Procedures   ____________________________________________   INITIAL IMPRESSION / ASSESSMENT AND PLAN / ED COURSE     30 year old male with apparent history of kidney stones presenting with recurrent right flank and right groin pain.  On exam currently he appears very well and says his pain is much improved from last night.  This pain has been intermittent over the last 6 months.  Apparently was told that his stones were small would likely pass and is concerned that they have gotten larger.  I cannot able to find record of prior CT imaging here.  His labs are notable for creatinine of 1.57 with no prior.  Otherwise no leukocytosis and his UA has no red cells or white cells.  Given his urine my suspicion for stone is low, however with his history and elevated creatinine we will obtain CT renal.  CT shows several stones intrarenal but no stone in the ureter or obstructing stone.  I do not think this would really account for his pain.  Discussed results with the patient.  Recommended he take NSAIDs.  I have low suspicion for any other acute surgical process given his normal CT and benign exam.  Patient stable for discharge.  Clinical Course as of 02/12/21 1614  Mon Feb 12, 2021  1143 IMPRESSION: 1. Multiple nonobstructive calculi are noted within both renal collecting systems measuring up to 5 mm in the upper pole collecting system of the left kidney. No ureteral  stones or findings of urinary tract obstruction.   [KM]    Clinical Course User Index [KM] Georga Hacking, MD     ____________________________________________   FINAL CLINICAL IMPRESSION(S) / ED DIAGNOSES  Final diagnoses:  Renal stone     ED Discharge Orders          Ordered    ibuprofen (MOTRIN IB) 200 MG tablet  Every 6 hours PRN        02/12/21 1150             Note:  This document was prepared using Dragon voice recognition software and may include unintentional dictation errors.    Georga Hacking, MD 02/12/21 [phone]

## 2021-02-12 NOTE — ED Notes (Signed)
Patient denies pain and is resting comfortably.  

## 2021-02-12 NOTE — ED Triage Notes (Signed)
Pt comes with c/o left sided flank pain that woke him up from his sleep. Pt denies any N/V/D. Pt states hx of kidney stones in past but they were small.

## 2021-05-08 ENCOUNTER — Other Ambulatory Visit: Payer: Self-pay

## 2021-05-08 ENCOUNTER — Encounter: Payer: Self-pay | Admitting: Family Medicine

## 2021-05-08 ENCOUNTER — Ambulatory Visit: Payer: Self-pay | Admitting: Family Medicine

## 2021-05-08 ENCOUNTER — Ambulatory Visit: Payer: Self-pay

## 2021-05-08 DIAGNOSIS — Z113 Encounter for screening for infections with a predominantly sexual mode of transmission: Secondary | ICD-10-CM

## 2021-05-08 DIAGNOSIS — Z299 Encounter for prophylactic measures, unspecified: Secondary | ICD-10-CM

## 2021-05-08 LAB — GRAM STAIN

## 2021-05-08 MED ORDER — CEFTRIAXONE SODIUM 500 MG IJ SOLR
500.0000 mg | Freq: Once | INTRAMUSCULAR | Status: AC
  Administered 2021-05-08: 11:00:00 500 mg via INTRAMUSCULAR

## 2021-05-08 MED ORDER — DOXYCYCLINE HYCLATE 100 MG PO TABS: 100.0000 mg | ORAL_TABLET | Freq: Two times a day (BID) | ORAL | 0 refills | Status: AC

## 2021-05-08 NOTE — Progress Notes (Signed)
Patient states he was having same symptoms in June 2022. Was treated as contact to gonorrhea 11/02/2020. Desires testing and treatment for symptoms.Burt Knack, RN

## 2021-05-08 NOTE — Progress Notes (Signed)
[facility] Department STI clinic/screening visit  Subjective:  Roy Bridges is a 30 y.o. male being seen today for an STI screening visit. The patient reports they do have symptoms.    Patient has the following medical conditions:  There are no problems to display for this patient.    Chief Complaint  Patient presents with   SEXUALLY TRANSMITTED DISEASE    HPI  Patient reports here for screening, has s/sx   Does the patient or their partner desires a pregnancy in the next year? No  Screening for MPX risk: Does the patient have an unexplained rash? No Is the patient MSM? No Does the patient endorse multiple sex partners or anonymous sex partners? Yes Did the patient have close or sexual contact with a person diagnosed with MPX? No Has the patient traveled outside the [place] where MPX is endemic? No Is there a high clinical suspicion for MPX-- evidenced by one of the following No  -Unlikely to be chickenpox  -Lymphadenopathy  -Rash that present in same phase of evolution on any given body part   See flowsheet for further details and programmatic requirements.    The following portions of the patient's history were reviewed and updated as appropriate: allergies, current medications, past medical history, past social history, past surgical history and problem list.  Objective:  There were no vitals filed for this visit.  Physical Exam Constitutional:      Appearance: Normal appearance.  HENT:     Head: Normocephalic.     Mouth/Throat:     Mouth: Mucous membranes are moist.     Pharynx: Oropharynx is clear. No oropharyngeal exudate.  Pulmonary:     Effort: Pulmonary effort is normal.  Genitourinary:    Penis: Normal.      Testes: Normal.     Comments: No lice, nits, or pest, no lesions or odor discharge.  Denies pain or tenderness with paplation of testicles.  No lesions, ulcers or masses present.    Musculoskeletal:     Cervical back: Normal range of  motion.  Lymphadenopathy:     Cervical: No cervical adenopathy.  Skin:    General: Skin is warm and dry.     Findings: No bruising, erythema, lesion or rash.  Neurological:     General: No focal deficit present.     Mental Status: He is alert and oriented to person, place, and time.  Psychiatric:        Mood and Affect: Mood normal.        Behavior: Behavior normal.      Assessment and Plan:  Roy Bridges is a 30 y.o. male presenting to the [facility] Department for STI screening  1. Screening examination for venereal disease Patient does have STI symptoms Patient accepted all screenings including  gram stain, urethral  GC and bloodwork for HIV/RPR.  Patient meets criteria for HepB screening? Yes. Ordered? No - declined  Patient meets criteria for HepC screening? Yes. Ordered? No - declined Recommended condom use with all sex Discussed importance of condom use for STI prevent  Treat gram stain per standing order Discussed time line for State Lab results and that patient will be called with positive results and encouraged patient to call if he had not heard in 2 weeks Recommended returning for continued or worsening symptoms.  - Gonococcus culture - Gram stain  2. Prophylactic measure  Pt treated prophylactically for gonorrhea and chlamydia   - cefTRIAXone (ROCEPHIN) injection 500 mg -  doxycycline (VIBRA-TABS) 100 MG tablet; Take 1 tablet (100 mg total) by mouth 2 (two) times daily for 7 days.  Dispense: 14 tablet; Refill: 0   No follow-ups on file.  Future Appointments  Date Time Provider Department Center  05/16/2021  8:40 AM [facility] PROVIDER [facility] None    Wendi Snipes, FNP

## 2021-05-08 NOTE — Progress Notes (Signed)
Patient treated per provider orders. Gram stain reviewed by provider. Ceftriaxone given, right glute, tolerated well, doxycycline given and patient counseled take one capsule, twice daily with food. Patient states understanding.Burt Knack, RN

## 2021-05-12 LAB — GONOCOCCUS CULTURE

## 2021-05-16 ENCOUNTER — Ambulatory Visit: Payer: Self-pay

## 2021-10-20 ENCOUNTER — Other Ambulatory Visit: Payer: Self-pay

## 2021-10-20 ENCOUNTER — Emergency Department
Admission: EM | Admit: 2021-10-20 | Discharge: 2021-10-20 | Disposition: A | Discharge status: Home / Self Care | Payer: Self-pay | Attending: Emergency Medicine | Admitting: Emergency Medicine

## 2021-10-20 ENCOUNTER — Emergency Department: Payer: Self-pay

## 2021-10-20 DIAGNOSIS — N200 Calculus of kidney: Secondary | ICD-10-CM | POA: Insufficient documentation

## 2021-10-20 DIAGNOSIS — R319 Hematuria, unspecified: Secondary | ICD-10-CM | POA: Insufficient documentation

## 2021-10-20 LAB — URINALYSIS, ROUTINE W REFLEX MICROSCOPIC
Bacteria, UA: NONE SEEN
Bilirubin Urine: NEGATIVE
Glucose, UA: NEGATIVE mg/dL
Ketones, ur: 5 mg/dL — AB
Leukocytes,Ua: NEGATIVE
Nitrite: NEGATIVE
Protein, ur: 100 mg/dL — AB
RBC / HPF: >50 RBC/hpf — ABNORMAL HIGH (ref 0–5)
Specific Gravity, Urine: 1.024 (ref 1.005–1.030)
Squamous Epithelial / LPF: NONE SEEN (ref 0–5)
WBC, UA: NONE SEEN WBC/hpf (ref 0–5)
pH: 6 (ref 5.0–8.0)

## 2021-10-20 LAB — CBC
HCT: 43.4 % (ref 39.0–52.0)
Hemoglobin: 14.5 g/dL (ref 13.0–17.0)
MCH: 26.9 pg (ref 26.0–34.0)
MCHC: 33.4 g/dL (ref 30.0–36.0)
MCV: 80.4 fL (ref 80.0–100.0)
Platelets: 260 10*3/uL (ref 150–400)
RBC: 5.4 MIL/uL (ref 4.22–5.81)
RDW: 13.2 % (ref 11.5–15.5)
WBC: 5.9 10*3/uL (ref 4.0–10.5)
nRBC: 0 % (ref 0.0–0.2)

## 2021-10-20 LAB — COMPREHENSIVE METABOLIC PANEL
ALT: 11 U/L (ref 0–44)
AST: 19 U/L (ref 15–41)
Albumin: 4.4 g/dL (ref 3.5–5.0)
Alkaline Phosphatase: 49 U/L (ref 38–126)
Anion gap: 6 (ref 5–15)
BUN: 11 mg/dL (ref 6–20)
CO2: 28 mmol/L (ref 22–32)
Calcium: 9.2 mg/dL (ref 8.9–10.3)
Chloride: 104 mmol/L (ref 98–111)
Creatinine, Ser: 1.02 mg/dL (ref 0.61–1.24)
GFR, Estimated: >60 mL/min (ref >60)
Glucose, Bld: 102 mg/dL — ABNORMAL HIGH (ref 70–99)
Potassium: 3.9 mmol/L (ref 3.5–5.1)
Sodium: 138 mmol/L (ref 135–145)
Total Bilirubin: 2.2 mg/dL — ABNORMAL HIGH (ref 0.3–1.2)
Total Protein: 8.2 g/dL — ABNORMAL HIGH (ref 6.5–8.1)

## 2021-10-20 LAB — LIPASE, BLOOD: Lipase: 28 U/L (ref 11–51)

## 2021-10-20 MED ORDER — ONDANSETRON 4 MG PO TBDP: 4.0000 mg | ORAL_TABLET | Freq: Three times a day (TID) | ORAL | 0 refills | Status: AC | PRN

## 2021-10-20 MED ORDER — OXYCODONE-ACETAMINOPHEN 5-325 MG PO TABS: 1.0000 | ORAL_TABLET | ORAL | 0 refills | Status: AC | PRN

## 2021-10-20 MED ORDER — KETOROLAC TROMETHAMINE 30 MG/ML IJ SOLN
30.0000 mg | Freq: Once | INTRAMUSCULAR | Status: AC
  Administered 2021-10-20: 30 mg via INTRAMUSCULAR
  Filled 2021-10-20: qty 1

## 2021-10-20 NOTE — ED Notes (Signed)
Pt to CT

## 2021-10-20 NOTE — ED Provider Notes (Signed)
[facility] Provider Note    Event Date/Time   First MD Initiated Contact with Patient 10/20/21 0350     (approximate)   History   Chief Complaint Abdominal Pain   HPI  Roy Bridges is a 31 y.o. male with past medical history of kidney stones who presents to the ED complaining of abdominal pain.  Patient reports that over the past couple months he has been dealing with intermittent pain in both flanks as well as both sides of his abdomen.  He describes the pain as sharp and sudden, not exacerbated or alleviated by thing in particular.  Pain has become more constant over the past few hours, when it woke him from sleep.  He states it affects both sides equally and seems to radiate down towards his groin.  He has noticed some hematuria but denies any dysuria or fevers.  He has not had any nausea or vomiting.  He has never seen a urologist for his kidney stones.     Physical Exam   Triage Vital Signs: ED Triage Vitals  Enc Vitals Group     BP 10/20/21 0028 105/74     Pulse Rate 10/20/21 0028 64     Resp 10/20/21 0028 16     Temp 10/20/21 0028 97.9 F (36.6 C)     Temp Source 10/20/21 0028 Oral     SpO2 10/20/21 0028 96 %     Weight 10/20/21 0028 125 lb (56.7 kg)     Height 10/20/21 0028 5\' 8"  (1.727 m)     Head Circumference --      Peak Flow --      Pain Score 10/20/21 0031 10     Pain Loc --      Pain Edu? --      Excl. in GC? --     Most recent vital signs: Vitals:   10/20/21 0430 10/20/21 0600  BP: 111/70 121/87  Pulse: 60 63  Resp: 20 18  Temp:    SpO2: 98% 98%    Constitutional: Alert and oriented. Eyes: Conjunctivae are normal. Head: Atraumatic. Nose: No congestion/rhinnorhea. Mouth/Throat: Mucous membranes are moist.  Cardiovascular: Normal rate, regular rhythm. Grossly normal heart sounds.  2+ radial pulses bilaterally. Respiratory: Normal respiratory effort.  No retractions. Lungs CTAB. Gastrointestinal: Soft and nontender.   No CVA tenderness bilaterally.  No distention. Musculoskeletal: No lower extremity tenderness nor edema.  Neurologic:  Normal speech and language. No gross focal neurologic deficits are appreciated.    ED Results / Procedures / Treatments   Labs (all labs ordered are listed, but only abnormal results are displayed) Labs Reviewed  COMPREHENSIVE METABOLIC PANEL - Abnormal; Notable for the following components:      Result Value   Glucose, Bld 102 (*)    Total Protein 8.2 (*)    Total Bilirubin 2.2 (*)    All other components within normal limits  URINALYSIS, ROUTINE W REFLEX MICROSCOPIC - Abnormal; Notable for the following components:   Color, Urine AMBER (*)    APPearance CLOUDY (*)    Hgb urine dipstick LARGE (*)    Ketones, ur 5 (*)    Protein, ur 100 (*)    RBC / HPF >50 (*)    All other components within normal limits  LIPASE, BLOOD  CBC   RADIOLOGY CT renal protocol reviewed and interpreted by me with distal left ureterolithiasis but no associated hydronephrosis.  PROCEDURES:  Critical Care performed: No  Procedures  MEDICATIONS ORDERED IN ED: Medications  ketorolac (TORADOL) 30 MG/ML injection 30 mg (30 mg Intramuscular Given 10/20/21 0529)     IMPRESSION / MDM / ASSESSMENT AND PLAN / ED COURSE  I reviewed the triage vital signs and the nursing notes.                              31 y.o. male with past medical history of kidney stones who presents to the ED complaining of bilateral flank and abdominal pain intermittently for the past few months, acutely worse overnight tonight and radiating towards his groin with some hematuria.  Patient's presentation is most consistent with acute presentation with potential threat to life or bodily function.  Differential diagnosis includes, but is not limited to, kidney stones, pyelonephritis, UTI, appendicitis, diverticulitis.  Patient well-appearing and in no acute distress, vital signs are unremarkable.  He has a  benign abdominal exam with no CVA tenderness, urinalysis does show hematuria but no signs of infection.  Symptoms seem consistent with recurrent kidney stone and we will further assess with CT renal protocol.  Labs thus far are reassuring with CBC showing no anemia or leukocytosis, BMP without electrolyte abnormality or AKI.  LFTs and lipase are unremarkable.  We will treat symptomatically with IM Toradol and reassess following imaging.  CT scan shows distal left ureterolithiasis without associated hydronephrosis.  Patient may have intermittently obstructing stone causing his pain and hematuria.  Given overall reassuring work-up and improved pain, patient is appropriate for discharge home with urology follow-up.  He was counseled to return to the ED for new worsening symptoms, patient agrees with plan.      FINAL CLINICAL IMPRESSION(S) / ED DIAGNOSES   Final diagnoses:  Kidney stones     Rx / DC Orders   ED Discharge Orders          Ordered    oxyCODONE-acetaminophen (PERCOCET) 5-325 MG tablet  Every 4 hours PRN        10/20/21 0625    ondansetron (ZOFRAN-ODT) 4 MG disintegrating tablet  Every 8 hours PRN        10/20/21 4196             Note:  This document was prepared using Dragon voice recognition software and may include unintentional dictation errors.   Chesley Noon, MD 10/20/21 [phone]

## 2021-10-20 NOTE — ED Triage Notes (Signed)
Pt presents to ER c/o mid abd pain and hematuria that started a few hours ago.  Pt states he has hx of kidney stones and thinks this may be the same, but states pain is worse.  Pt denies radiation of pain.  Pt also states when he urinated tonight, that the urine had some red/pink coloration to it, which is abnormal.  Pt is currently A&O x4 at this time in NAD.

## 2021-10-20 NOTE — ED Notes (Signed)
E-signature pad unavailable - Pt verbalized understanding of D/C information - no additional concerns at this time.  

## 2021-10-22 ENCOUNTER — Other Ambulatory Visit: Payer: Self-pay

## 2021-10-22 ENCOUNTER — Encounter: Payer: Self-pay | Admitting: Emergency Medicine

## 2021-10-22 ENCOUNTER — Emergency Department
Admission: EM | Admit: 2021-10-22 | Discharge: 2021-10-22 | Disposition: A | Discharge status: Home / Self Care | Payer: Self-pay | Attending: Emergency Medicine | Admitting: Emergency Medicine

## 2021-10-22 DIAGNOSIS — Z202 Contact with and (suspected) exposure to infections with a predominantly sexual mode of transmission: Secondary | ICD-10-CM | POA: Insufficient documentation

## 2021-10-22 DIAGNOSIS — R369 Urethral discharge, unspecified: Secondary | ICD-10-CM

## 2021-10-22 LAB — URINALYSIS, ROUTINE W REFLEX MICROSCOPIC
Bacteria, UA: NONE SEEN
Bilirubin Urine: NEGATIVE
Glucose, UA: NEGATIVE mg/dL
Ketones, ur: 20 mg/dL — AB
Leukocytes,Ua: NEGATIVE
Nitrite: NEGATIVE
Protein, ur: 30 mg/dL — AB
RBC / HPF: >50 RBC/hpf — ABNORMAL HIGH (ref 0–5)
Specific Gravity, Urine: 1.016 (ref 1.005–1.030)
Squamous Epithelial / LPF: NONE SEEN (ref 0–5)
pH: 6 (ref 5.0–8.0)

## 2021-10-22 LAB — CHLAMYDIA/NGC RT PCR (ARMC ONLY)
Chlamydia Tr: NOT DETECTED
N gonorrhoeae: NOT DETECTED

## 2021-10-22 MED ORDER — LIDOCAINE HCL (PF) 1 % IJ SOLN
INTRAMUSCULAR | Status: AC
  Filled 2021-10-22: qty 5

## 2021-10-22 MED ORDER — AZITHROMYCIN 500 MG PO TABS
1000.0000 mg | ORAL_TABLET | Freq: Once | ORAL | Status: AC
Start: 2021-10-22 — End: 2021-10-22
  Administered 2021-10-22: 1000 mg via ORAL

## 2021-10-22 MED ORDER — CEFTRIAXONE SODIUM 1 G IJ SOLR
INTRAMUSCULAR | Status: AC
  Filled 2021-10-22: qty 10

## 2021-10-22 MED ORDER — LIDOCAINE HCL (PF) 1 % IJ SOLN
5.0000 mL | Freq: Once | INTRAMUSCULAR | Status: AC
  Administered 2021-10-22: 5 mL

## 2021-10-22 MED ORDER — CEFTRIAXONE SODIUM 1 G IJ SOLR
500.0000 mg | Freq: Once | INTRAMUSCULAR | Status: AC
  Administered 2021-10-22: 500 mg via INTRAMUSCULAR

## 2021-10-22 MED ORDER — AZITHROMYCIN 500 MG PO TABS
ORAL_TABLET | ORAL | Status: AC
  Filled 2021-10-22: qty 2

## 2021-10-22 NOTE — ED Triage Notes (Signed)
Pt here co lower abd pain for few days has been here for the same recently and dx with kidney stones. Pt now has penile discharge and wants to get checked for STI's.

## 2021-10-22 NOTE — ED Notes (Signed)
See triage note  presents with abd pain for couple of days  states he also noticed some penile discharge and blood in urine

## 2021-10-22 NOTE — ED Provider Notes (Signed)
[facility] Provider Note    Event Date/Time   First MD Initiated Contact with Patient 10/22/21 [phone]     (approximate)   History   Abdominal Pain and Exposure to STD   HPI  Roy Bridges is a 31 y.o. male   to the ED with complaint of dysuria and yellow penile discharge.  Patient reports that he had unprotected sex on Wednesday.  He reports that yesterday he noticed a yellow penile discharge with dysuria which is new.  He has also been seen in the emergency department recently for kidney stones and has not followed up with urology since it was recently.      Physical Exam   Triage Vital Signs: ED Triage Vitals [10/22/21 0834]  Enc Vitals Group     BP 119/75     Pulse Rate 67     Resp 20     Temp 98.8 F (37.1 C)     Temp Source Oral     SpO2 100 %     Weight 125 lb (56.7 kg)     Height 5\' 8"  (1.727 m)     Head Circumference      Peak Flow      Pain Score 10     Pain Loc      Pain Edu?      Excl. in GC?     Most recent vital signs: Vitals:   10/22/21 0834  BP: 119/75  Pulse: 67  Resp: 20  Temp: 98.8 F (37.1 C)  SpO2: 100%     General: Awake, no distress.  CV:  Good peripheral perfusion.  Resp:  Normal effort.  Abd:  No distention.  Other:     ED Results / Procedures / Treatments   Labs (all labs ordered are listed, but only abnormal results are displayed) Labs Reviewed  URINALYSIS, ROUTINE W REFLEX MICROSCOPIC - Abnormal; Notable for the following components:      Result Value   Color, Urine YELLOW (*)    APPearance HAZY (*)    Hgb urine dipstick LARGE (*)    Ketones, ur 20 (*)    Protein, ur 30 (*)    RBC / HPF >50 (*)    All other components within normal limits  CHLAMYDIA/NGC RT PCR ([facility] ONLY)                PROCEDURES:  Critical Care performed:   Procedures   MEDICATIONS ORDERED IN ED: Medications  lidocaine (PF) (XYLOCAINE) 1 % injection 5 mL (5 mLs Infiltration Given 10/22/21 1043)  cefTRIAXone  (ROCEPHIN) injection 500 mg (500 mg Intramuscular Given 10/22/21 1044)  azithromycin (ZITHROMAX) tablet 1,000 mg (1,000 mg Oral Given 10/22/21 1043)     IMPRESSION / MDM / ASSESSMENT AND PLAN / ED COURSE  I reviewed the triage vital signs and the nursing notes.   Differential diagnosis includes, but is not limited to, urolithiasis, urinary tract infection, exposure STD, dysuria.  31 year old presents to the ED with complaint of dysuria and penile discharge.  He was seen on 10/20/2021 for urolithiasis and also has a history of the same.  Patient states that the penile discharge has been yellow and is different from when he was seen on the third.  Patient has reason to believe that he has been exposed to an STD and called the health department who referred him to the emergency department.  Urinalysis still shows greater than 50,000 RBCs.  Prior to discharge I discussed possibility  of an STD given his unprotected sex prior to his symptoms and he prefers to go ahead and get the Rocephin and Zithromax to be treated for gonorrhea and chlamydia.  Patient is still encouraged to follow-up with the [facility] department if any continued problems.  Also Dr. Signa Kell was the urologist on-call when he was discharged on 10/20/2021 and told to follow-up due to his urolithiasis.      Patient's presentation is most consistent with acute, uncomplicated illness.  FINAL CLINICAL IMPRESSION(S) / ED DIAGNOSES   Final diagnoses:  Penile discharge  Exposure to STD     Rx / DC Orders   ED Discharge Orders     None        Note:  This document was prepared using Dragon voice recognition software and may include unintentional dictation errors.   Tommi Rumps, PA-C 10/22/21 1100    Jene Every, MD 10/22/21 1340

## 2021-10-22 NOTE — Discharge Instructions (Addendum)
Dr. Richardo Hanks who was on-call for urology on 10/20/2021 he was seen in the department for kidney stones.  This is a Dr. Claiborne Billings you are supposed to follow-up with.  Call make an appointment as you are having lots of issues with kidney stones.  It may be several weeks before you can be seen but keep that appointment.  Also today you are seen for probable exposure to sexual transmitted disease and was treated and any further testing should be done at the health department.  Also your partner needs to be treated before having intercourse.

## 2021-10-23 ENCOUNTER — Ambulatory Visit: Payer: Self-pay

## 2022-01-28 ENCOUNTER — Encounter: Payer: Self-pay | Admitting: Nurse Practitioner

## 2022-01-28 ENCOUNTER — Ambulatory Visit: Payer: Self-pay | Admitting: Nurse Practitioner

## 2022-01-28 DIAGNOSIS — Z113 Encounter for screening for infections with a predominantly sexual mode of transmission: Secondary | ICD-10-CM

## 2022-01-28 DIAGNOSIS — N200 Calculus of kidney: Secondary | ICD-10-CM | POA: Insufficient documentation

## 2022-01-28 LAB — HM HIV SCREENING LAB: HM HIV Screening: NEGATIVE

## 2022-01-28 LAB — HEPATITIS B SURFACE ANTIGEN: Hepatitis B Surface Ag: NONREACTIVE

## 2022-01-28 LAB — HM HEPATITIS C SCREENING LAB: HM Hepatitis Screen: NEGATIVE

## 2022-01-28 NOTE — Progress Notes (Signed)
Pt here for STD screening.  Condoms given.  Gwenda Heiner M Shalisha Clausing, RN  

## 2022-01-28 NOTE — Progress Notes (Signed)
[facility] Department STI clinic/screening visit  Subjective:  LAKOTA MARKGRAF is a 31 y.o. male being seen today for an STI screening visit. The patient reports they do not have symptoms.    Patient has the following medical conditions:   Patient Active Problem List   Diagnosis Date Noted   Kidney stones 01/28/2022     Chief Complaint  Patient presents with   SEXUALLY TRANSMITTED DISEASE    Screening     HPI  Patient reports to clinic today for an STD screening.  Patient is asymptomatic.    Does the patient or their partner desires a pregnancy in the next year? No  Screening for MPX risk: Does the patient have an unexplained rash? No Is the patient MSM? No Does the patient endorse multiple sex partners or anonymous sex partners? No Did the patient have close or sexual contact with a person diagnosed with MPX? No Has the patient traveled outside the [place] where MPX is endemic? No Is there a high clinical suspicion for MPX-- evidenced by one of the following No  -Unlikely to be chickenpox  -Lymphadenopathy  -Rash that present in same phase of evolution on any given body part   See flowsheet for further details and programmatic requirements.   Immunization History  Administered Date(s) Administered   Tdap 10/30/2020     The following portions of the patient's history were reviewed and updated as appropriate: allergies, current medications, past medical history, past social history, past surgical history and problem list.  Objective:  There were no vitals filed for this visit.  Physical Exam Constitutional:      Appearance: Normal appearance.  HENT:     Head: Normocephalic. No abrasion, masses or laceration. Hair is normal.     Right Ear: External ear normal.     Left Ear: External ear normal.     Nose: Nose normal.     Mouth/Throat:     Lips: Pink.     Mouth: Mucous membranes are moist. No oral lesions.     Pharynx: No pharyngeal swelling,  oropharyngeal exudate, posterior oropharyngeal erythema or uvula swelling.     Tonsils: No tonsillar exudate or tonsillar abscesses.     Comments: No visible signs of dental caries  Eyes:     General: Lids are normal.        Right eye: No discharge.        Left eye: No discharge.     Conjunctiva/sclera: Conjunctivae normal.     Right eye: No exudate.    Left eye: No exudate. Abdominal:     General: Abdomen is flat.     Palpations: Abdomen is soft.     Tenderness: There is no abdominal tenderness. There is no rebound.  Genitourinary:    Pubic Area: No rash or pubic lice.      Penis: Normal and circumcised. No erythema or discharge.      Testes: Normal.        Right: Mass or tenderness not present.        Left: Mass or tenderness not present.     Rectum: Normal.     Comments: Discharge amount: None Color: None  Musculoskeletal:     Cervical back: Full passive range of motion without pain, normal range of motion and neck supple.  Lymphadenopathy:     Cervical: No cervical adenopathy.     Right cervical: No superficial, deep or posterior cervical adenopathy.    Left cervical: No superficial, deep or  posterior cervical adenopathy.     Upper Body:     Right upper body: No supraclavicular, axillary or epitrochlear adenopathy.     Left upper body: No supraclavicular, axillary or epitrochlear adenopathy.     Lower Body: Right inguinal adenopathy present. Left inguinal adenopathy present.  Skin:    General: Skin is warm and dry.     Findings: No lesion or rash.  Neurological:     Mental Status: He is alert and oriented to person, place, and time.  Psychiatric:        Attention and Perception: Attention normal.        Mood and Affect: Mood normal.        Speech: Speech normal.        Behavior: Behavior normal. Behavior is cooperative.       Assessment and Plan:  HARSH TRULOCK is a 31 y.o. male presenting to the [facility] Department for STI screening  1.  Screening examination for venereal disease -31 year old male in clinic today for STD screening. -Patient does not have STI symptoms Patient accepted all screenings including urine  CT/GC and bloodwork for HIV/RPR.  Patient meets criteria for HepB screening? Yes. Ordered? Yes Patient meets criteria for HepC screening? Yes. Ordered? Yes Recommended condom use with all sex Discussed importance of condom use for STI prevent   Discussed time line for [facility] results and that patient will be called with positive results and encouraged patient to call if he had not heard in 2 weeks Recommended returning for continued or worsening symptoms.    - HIV/HCV [facility] - Syphilis Serology, [facility] - HBV Antigen/Antibody [facility] - Chlamydia/GC NAA, Confirmation   Total time spent: 30 minutes   Return if symptoms worsen or fail to improve.    Glenna Fellows, FNP

## 2022-01-30 LAB — CHLAMYDIA/GC NAA, CONFIRMATION
Chlamydia trachomatis, NAA: NEGATIVE
Neisseria gonorrhoeae, NAA: NEGATIVE

## 2022-02-04 ENCOUNTER — Ambulatory Visit: Payer: Self-pay

## 2022-03-05 IMAGING — CT CT RENAL STONE PROTOCOL
3 of 4 series · 8 of 46 positions shown, 15 images · non-contrast
Comparison: No priors.

CLINICAL DATA: 29-year-old male with history of flank pain.
Suspected kidney stone.

EXAM:
CT ABDOMEN AND PELVIS WITHOUT CONTRAST
TECHNIQUE: Multidetector CT imaging of the abdomen and pelvis was performed
following the standard protocol without IV contrast.

[Series 4: lung bases · axial · 0.65mm/px · z∈[-627,-577]mm · 4 of 18 slices shown, 9 images]
[im 4/18  soft-tissue]
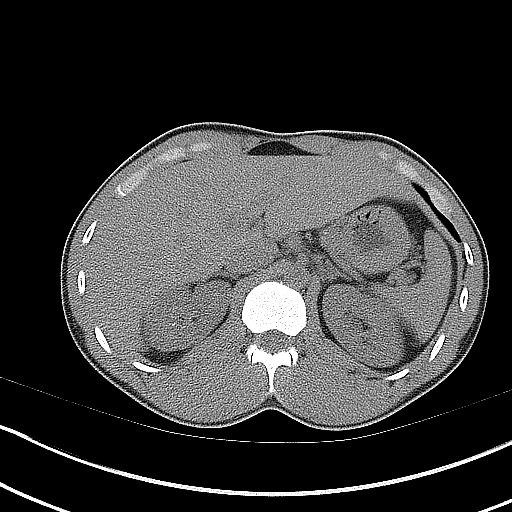
[im 4/18  lung]
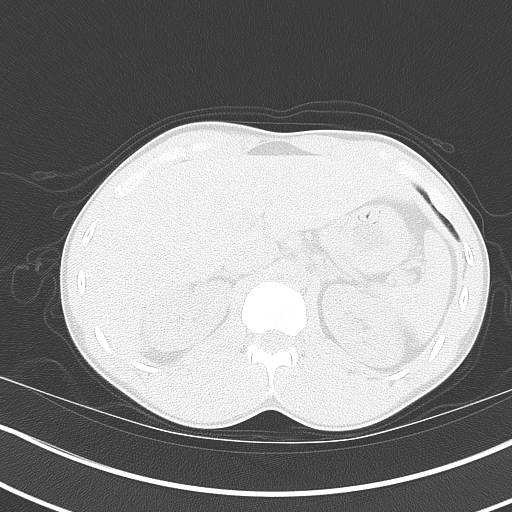
[im 4/18  bone]
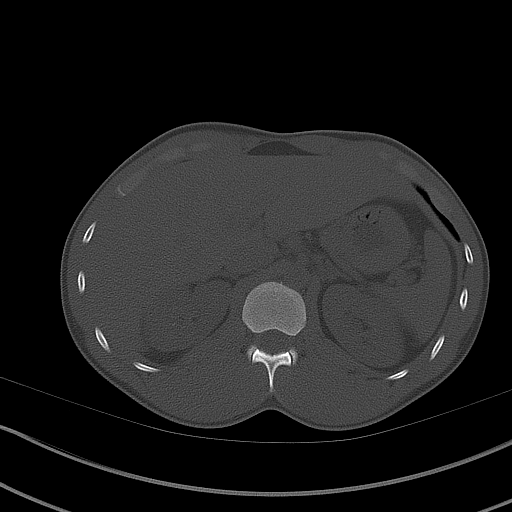
[im 7/18  soft-tissue]
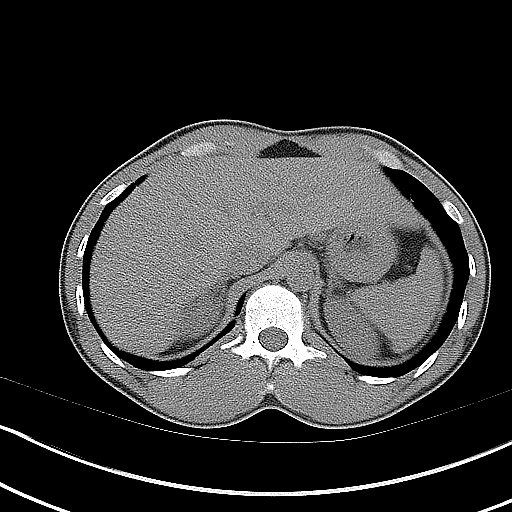
[im 7/18  lung]
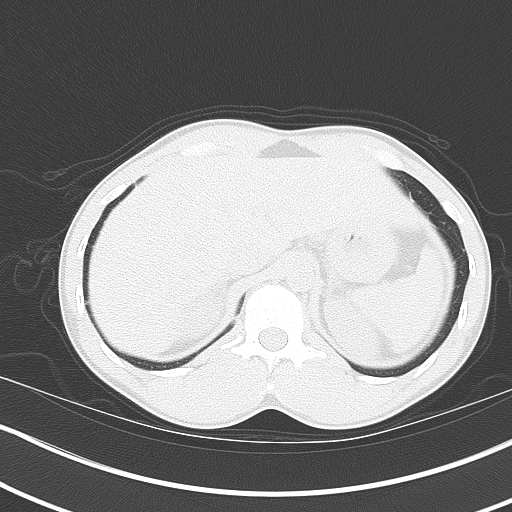
[im 11/18  soft-tissue]
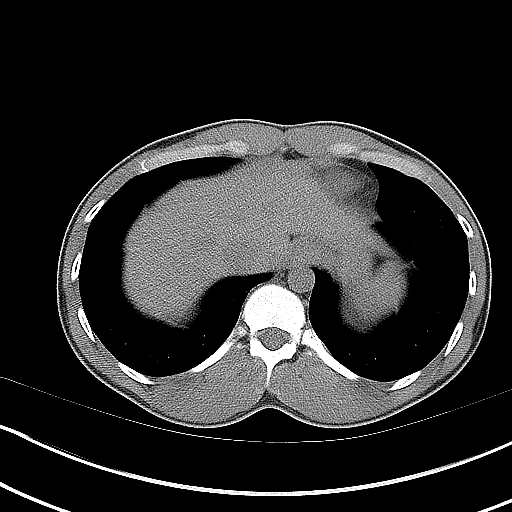
[im 11/18  lung]
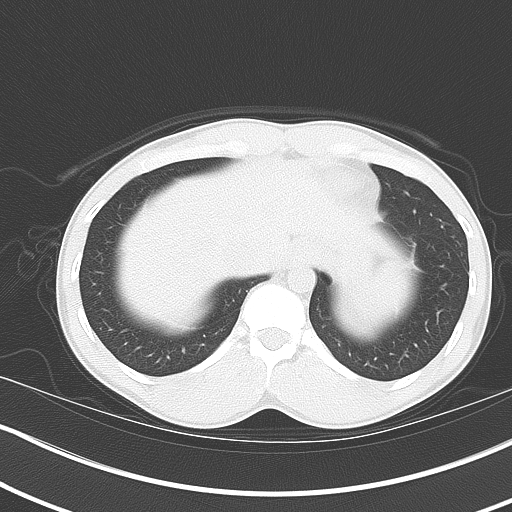
[im 14/18  soft-tissue]
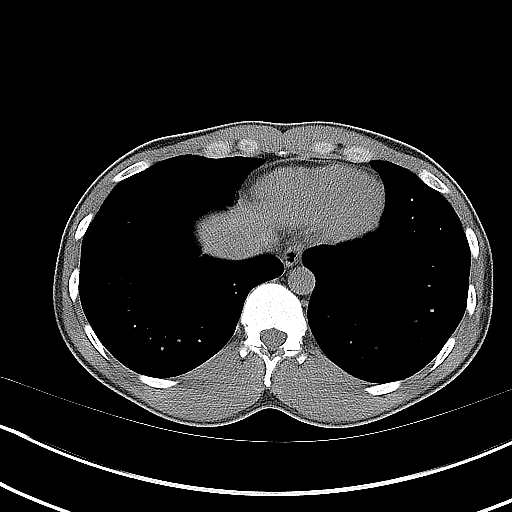
[im 14/18  lung]
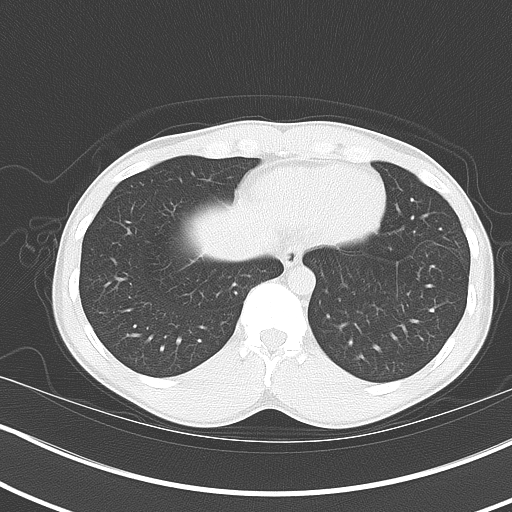

[Series 5: coronal · coronal · 0.64mm/px · 3 of 114 slices shown, 4 images]
[im 38/114  soft-tissue]
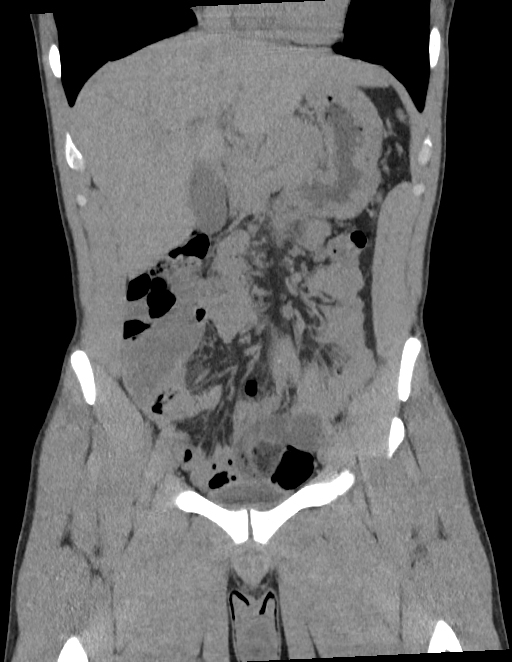
[im 51/114  soft-tissue]
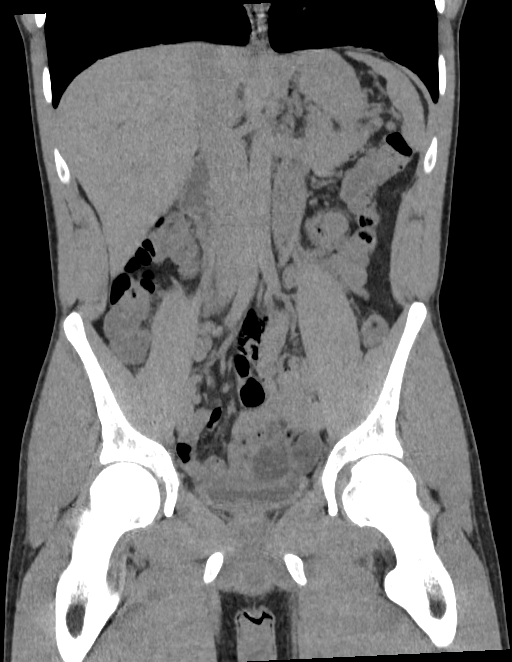
[im 51/114  bone]
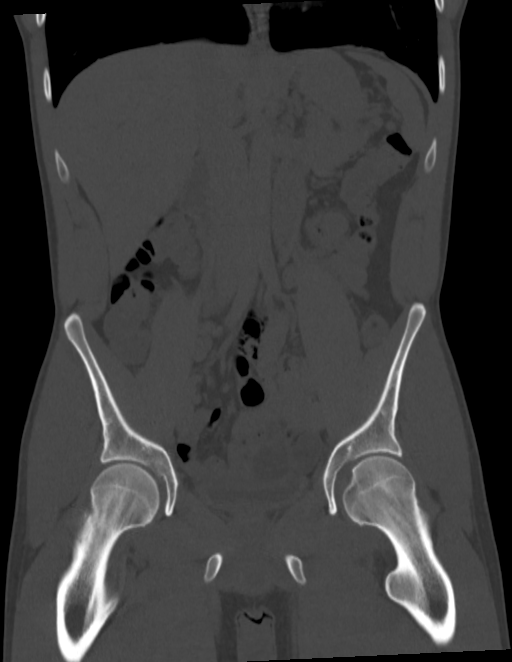
[im 63/114  soft-tissue]
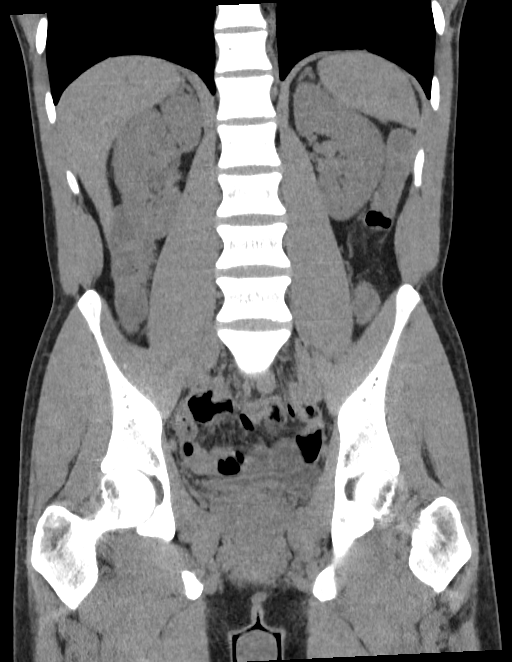

[Series 6: sagittal · sagittal · 0.44mm/px · 1 of 166 slices shown, 2 images]
[im 56/166  soft-tissue]
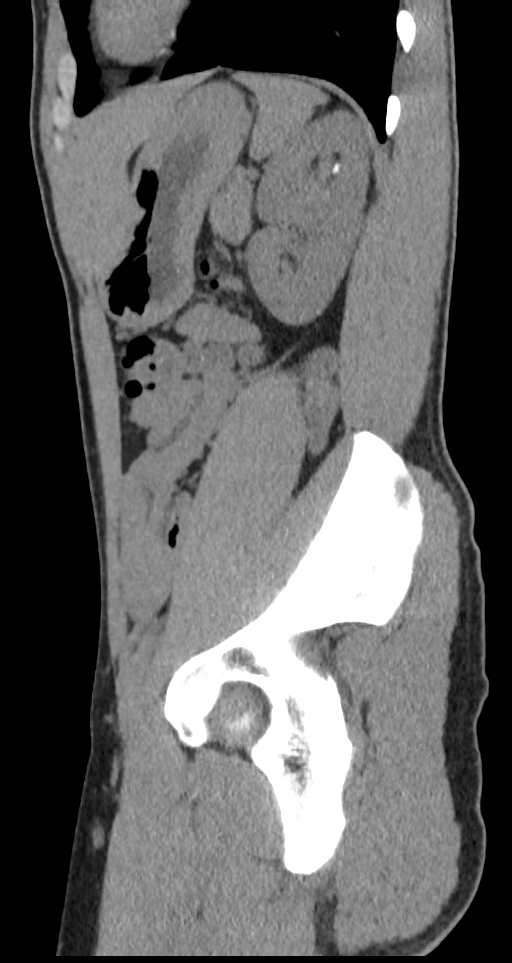
[im 56/166  bone]
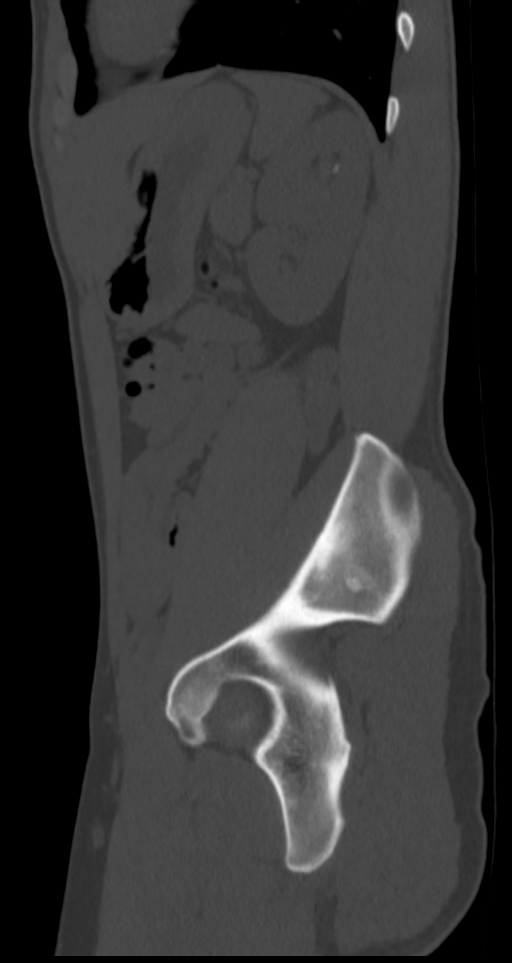

[8 of 46 positions shown; findings below may reference images not displayed]

FINDINGS: Lower chest: Unremarkable.

Hepatobiliary: No definite suspicious cystic or solid hepatic
lesions are confidently identified on today's noncontrast CT
examination. Unenhanced appearance of the gallbladder is normal.

Pancreas: No definite pancreatic mass or peripancreatic fluid
collections or inflammatory changes are noted on today's noncontrast
CT examination.

Spleen: Unremarkable.

Adrenals/Urinary Tract: Multiple nonobstructive calculi are noted
within the collecting systems of both kidneys measuring up to 5 mm
in the upper pole collecting system of the left kidney. No
additional calculi are noted along the course of either ureter, or
within the lumen of the urinary bladder. There is no
hydroureteronephrosis. Unenhanced appearance of the kidneys,
bilateral adrenal glands and urinary bladder is otherwise normal.

Stomach/Bowel: Unenhanced appearance of the stomach is normal. No
pathologic dilatation of small bowel or colon. The appendix is not
confidently identified and may be surgically absent. Regardless,
there are no inflammatory changes noted adjacent to the cecum to
suggest the presence of an acute appendicitis at this time.

Vascular/Lymphatic: No atherosclerotic calcifications are noted in
the abdominal aorta or pelvic vasculature. No lymphadenopathy noted
in the abdomen or pelvis.

Reproductive: Prostate gland and seminal vesicles are unremarkable
in appearance.

Other: No significant volume of ascites.  No pneumoperitoneum.

Musculoskeletal: There are no aggressive appearing lytic or blastic
lesions noted in the visualized portions of the skeleton.
IMPRESSION: 1. Multiple nonobstructive calculi are noted within both renal
collecting systems measuring up to 5 mm in the upper pole collecting
system of the left kidney. No ureteral stones or findings of urinary
tract obstruction.

## 2022-11-10 IMAGING — CT CT RENAL STONE PROTOCOL
2 of 4 series · 16 of 46 positions shown, 18 images · non-contrast
Comparison: Noncontrast CT Abdomen and Pelvis 02/12/2021.

CLINICAL DATA: 30-year-old male with mid abdominal pain, hematuria,
flank pain, history of kidney stones.



[Series 2: stone full standard · axial · 0.68mm/px · z∈[-814,-439]mm · 13 of 83 slices shown, 15 images]
[im 4/83  soft-tissue]
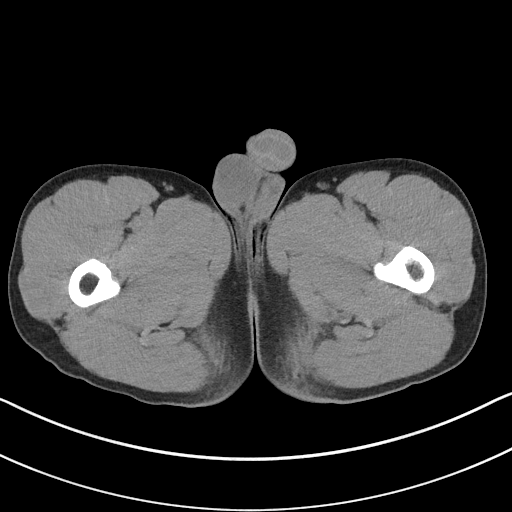
[im 4/83  bone]
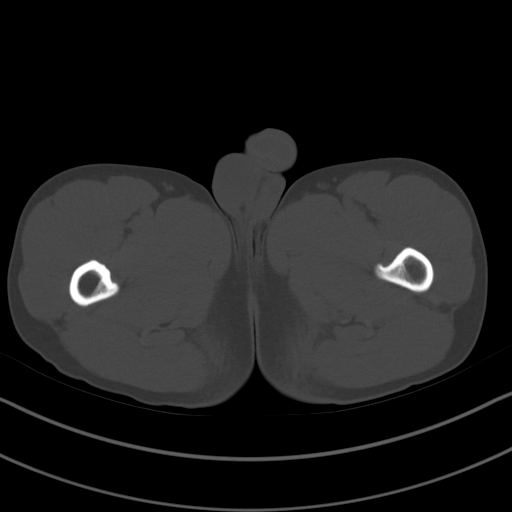
[im 10/83  soft-tissue]
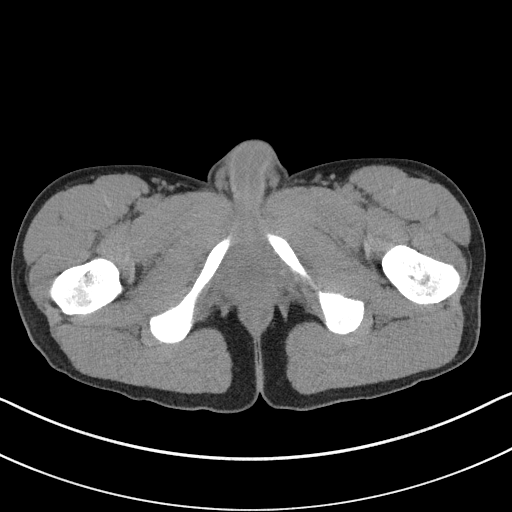
[im 17/83  soft-tissue]
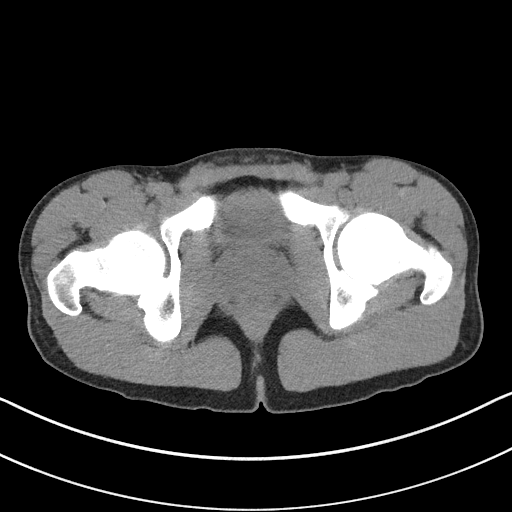
[im 23/83  soft-tissue]
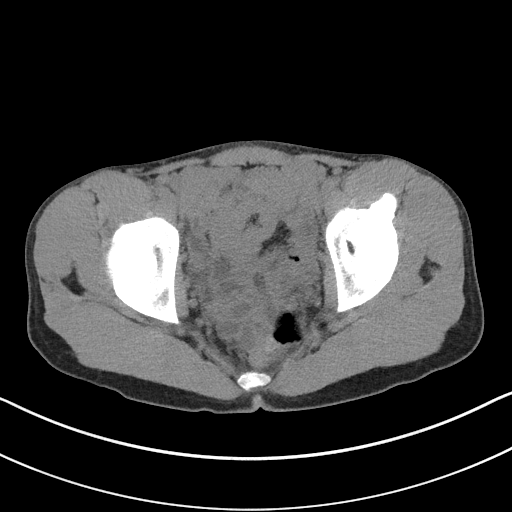
[im 30/83  soft-tissue]
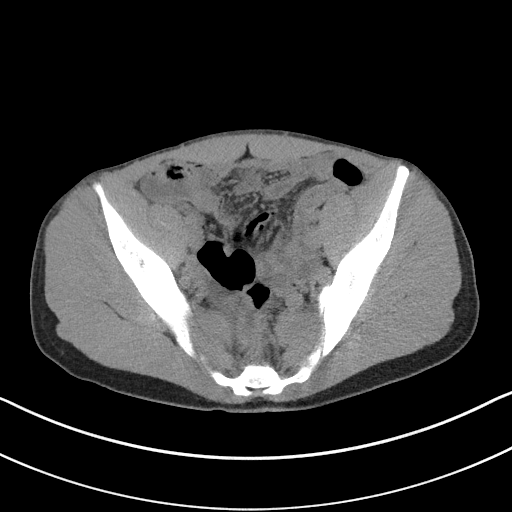
[im 37/83  soft-tissue]
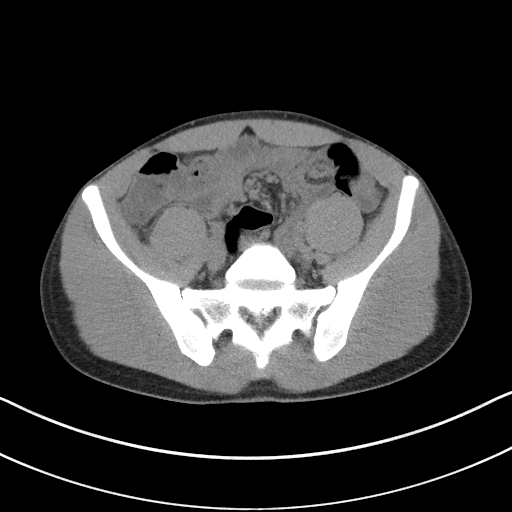
[im 43/83  soft-tissue]
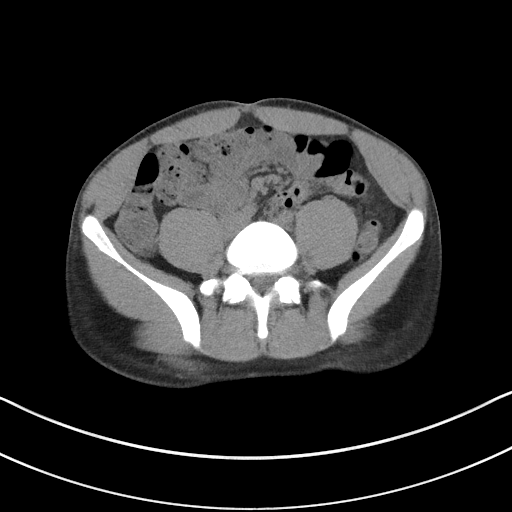
[im 46/83  soft-tissue]
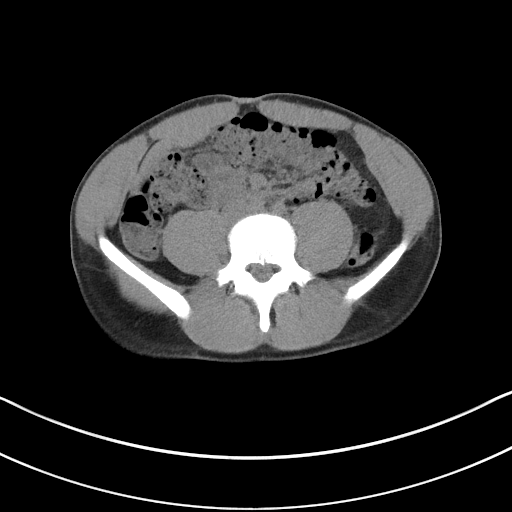
[im 53/83  soft-tissue]
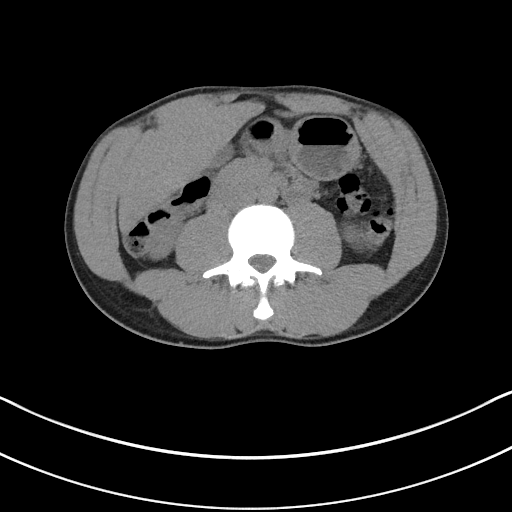
[im 53/83  bone]
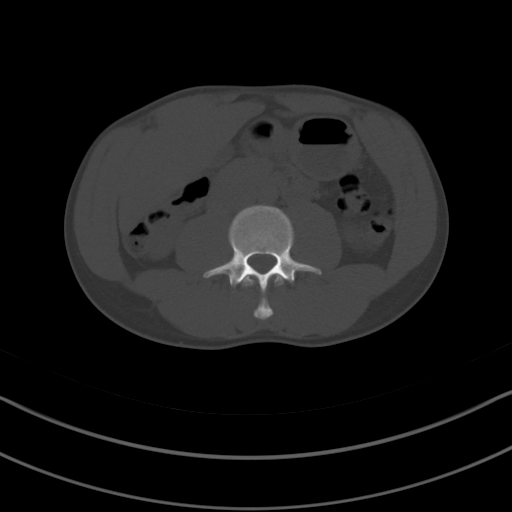
[im 60/83  soft-tissue]
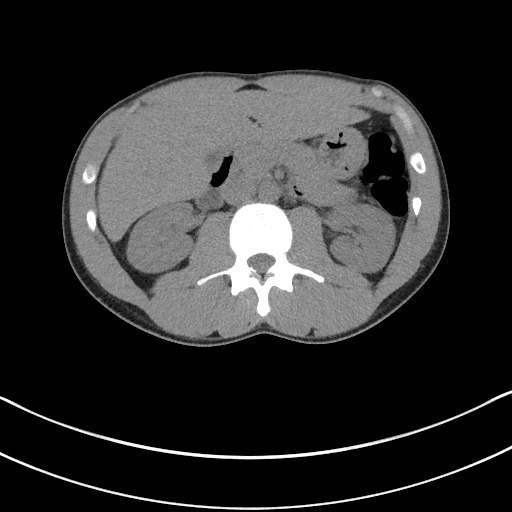
[im 66/83  soft-tissue]
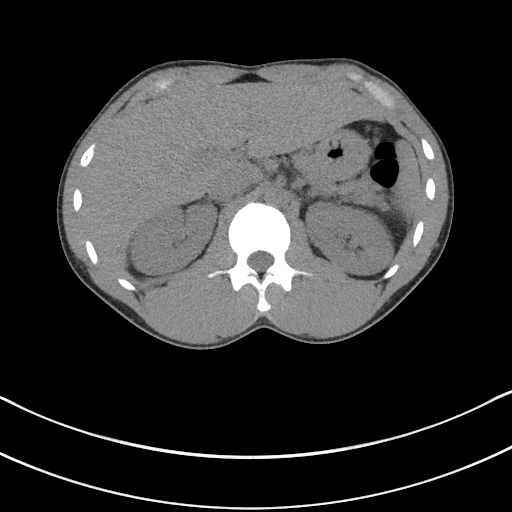
[im 73/83  soft-tissue]
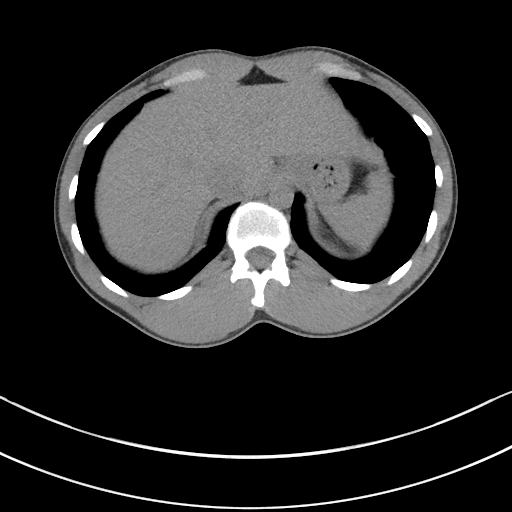
[im 79/83  soft-tissue]
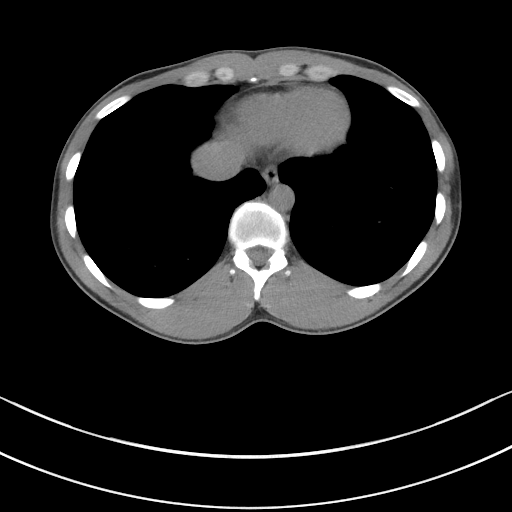

[Series 5: coronal · coronal · 0.64mm/px · 3 of 129 slices shown]
[im 43/129  soft-tissue]
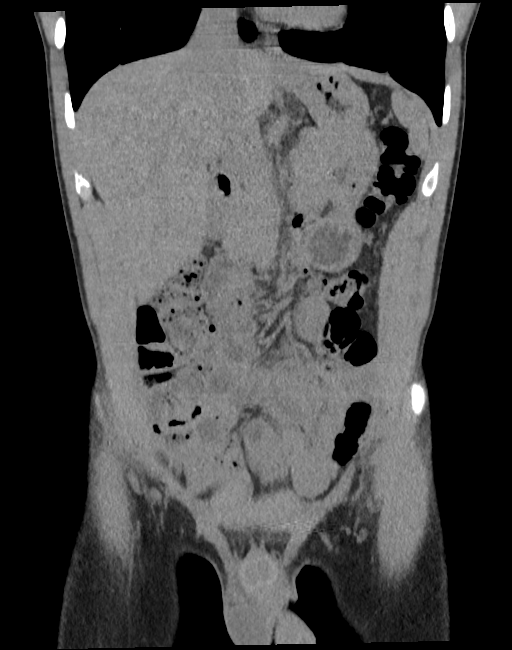
[im 57/129  soft-tissue]
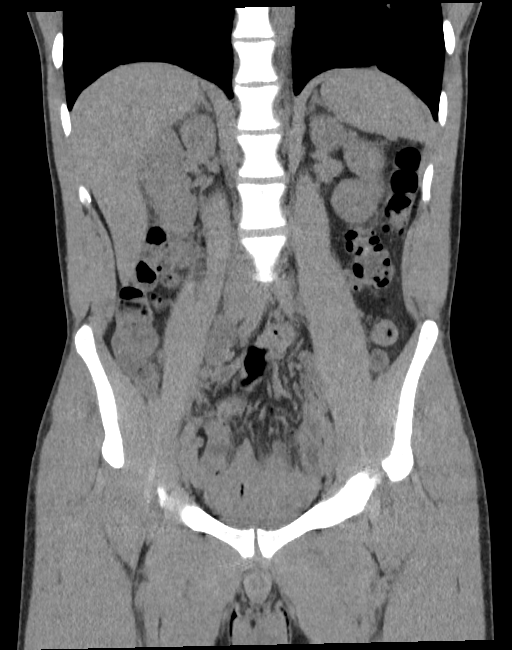
[im 72/129  soft-tissue]
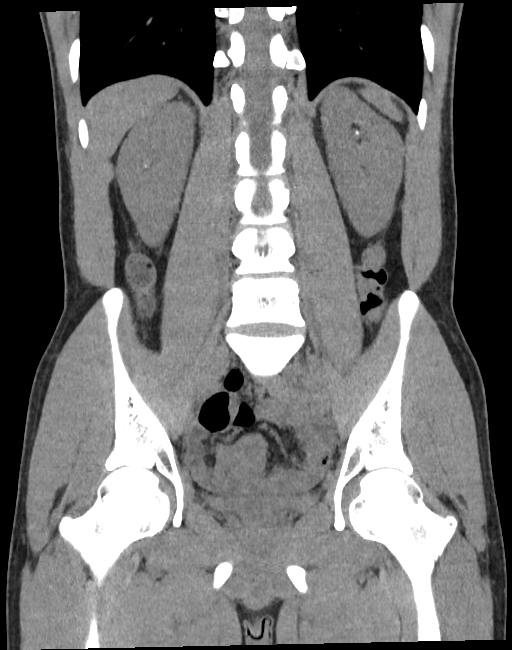

[16 of 46 positions shown; findings below may reference images not displayed]

FINDINGS: Lower chest: Negative.

Hepatobiliary: Negative noncontrast liver and gallbladder.

Pancreas: Negative.

Spleen: Negative.

Adrenals/Urinary Tract: Normal adrenal glands.

Bilateral mostly punctate nephrolithiasis appears not significantly
changed from the CT last year. Individual calculi up to 4 mm. No
hydronephrosis. No pararenal inflammation is evident. Proximal
ureters seem decompressed.

However, there is a new 3-4 mm calcification in the left hemipelvis
which is most suspicious for a distal left ureteral stone. See
series 2, image 63 and coronal image 81. Urinary bladder is
decompressed.

Stomach/Bowel: No dilated large or small bowel. Evidence of normal
gas containing appendix in the right hemipelvis on series 2, image
56. No free air, free fluid, or mesenteric inflammation identified.
Unremarkable noncontrast stomach.

Vascular/Lymphatic: Normal caliber abdominal aorta. No calcified
atherosclerosis or lymphadenopathy identified.

Reproductive: Negative.

Other: No pelvic free fluid.

Musculoskeletal: Stable, negative.
IMPRESSION: 1. A 4 mm left pelvic calcification is new from [facility] CT and
most suspicious for a distal left ureteral calculus - despite lack
of hydronephrosis or hydroureter.
2. Otherwise stable bilateral nephrolithiasis.
3. No other acute or inflammatory process identified in the
noncontrast abdomen or pelvis.
